# Patient Record
Sex: Female | Born: 1975 | Race: White | Hispanic: No | Marital: Married | Smoking: Never smoker
Health system: Southern US, Community
[De-identification: ages and names within clinical notes are randomized; demographics above are authoritative.]

## PROBLEM LIST (undated history)

## (undated) DIAGNOSIS — F32A Depression, unspecified: Secondary | ICD-10-CM

## (undated) DIAGNOSIS — L858 Other specified epidermal thickening: Secondary | ICD-10-CM

## (undated) DIAGNOSIS — F419 Anxiety disorder, unspecified: Secondary | ICD-10-CM

## (undated) DIAGNOSIS — E785 Hyperlipidemia, unspecified: Secondary | ICD-10-CM

## (undated) DIAGNOSIS — T7840XA Allergy, unspecified, initial encounter: Secondary | ICD-10-CM

## (undated) DIAGNOSIS — I10 Essential (primary) hypertension: Secondary | ICD-10-CM

## (undated) DIAGNOSIS — F329 Major depressive disorder, single episode, unspecified: Secondary | ICD-10-CM

## (undated) HISTORY — DX: Other specified epidermal thickening: L85.8

## (undated) HISTORY — DX: Hyperlipidemia, unspecified: E78.5

## (undated) HISTORY — DX: Allergy, unspecified, initial encounter: T78.40XA

## (undated) HISTORY — DX: Depression, unspecified: F32.A

## (undated) HISTORY — DX: Major depressive disorder, single episode, unspecified: F32.9

## (undated) HISTORY — PX: TONSILLECTOMY: SUR1361

## (undated) HISTORY — PX: WISDOM TOOTH EXTRACTION: SHX21

---

## 2000-07-18 ENCOUNTER — Other Ambulatory Visit: Admission: RE | Admit: 2000-07-18 | Discharge: 2000-07-18 | Payer: Self-pay | Admitting: Family Medicine

## 2007-01-18 ENCOUNTER — Other Ambulatory Visit: Admission: RE | Admit: 2007-01-18 | Discharge: 2007-01-18 | Payer: Self-pay | Admitting: Obstetrics and Gynecology

## 2008-01-24 ENCOUNTER — Other Ambulatory Visit: Admission: RE | Admit: 2008-01-24 | Discharge: 2008-01-24 | Payer: Self-pay | Admitting: Obstetrics and Gynecology

## 2009-03-20 ENCOUNTER — Other Ambulatory Visit: Admission: RE | Admit: 2009-03-20 | Discharge: 2009-03-20 | Payer: Self-pay | Admitting: Family Medicine

## 2010-03-24 ENCOUNTER — Other Ambulatory Visit
Admission: RE | Admit: 2010-03-24 | Discharge: 2010-03-24 | Payer: Self-pay | Source: Home / Self Care | Admitting: Family Medicine

## 2011-11-06 ENCOUNTER — Emergency Department (HOSPITAL_COMMUNITY)
Admission: EM | Admit: 2011-11-06 | Discharge: 2011-11-06 | Disposition: A | Discharge status: Home / Self Care | Payer: No Typology Code available for payment source | Attending: Emergency Medicine | Admitting: Emergency Medicine

## 2011-11-06 ENCOUNTER — Encounter (HOSPITAL_COMMUNITY): Payer: Self-pay | Admitting: *Deleted

## 2011-11-06 DIAGNOSIS — S61259A Open bite of unspecified finger without damage to nail, initial encounter: Secondary | ICD-10-CM

## 2011-11-06 DIAGNOSIS — Z203 Contact with and (suspected) exposure to rabies: Secondary | ICD-10-CM | POA: Insufficient documentation

## 2011-11-06 DIAGNOSIS — IMO0001 Reserved for inherently not codable concepts without codable children: Secondary | ICD-10-CM | POA: Insufficient documentation

## 2011-11-06 DIAGNOSIS — Y92410 Unspecified street and highway as the place of occurrence of the external cause: Secondary | ICD-10-CM | POA: Insufficient documentation

## 2011-11-06 DIAGNOSIS — Z23 Encounter for immunization: Secondary | ICD-10-CM | POA: Insufficient documentation

## 2011-11-06 HISTORY — DX: Anxiety disorder, unspecified: F41.9

## 2011-11-06 MED ORDER — RABIES IMMUNE GLOBULIN 150 UNIT/ML IM INJ
INJECTION | INTRAMUSCULAR | Status: AC
  Filled 2011-11-06: qty 12

## 2011-11-06 MED ORDER — RABIES IMMUNE GLOBULIN 150 UNIT/ML IM INJ
20.0000 [IU]/kg | INJECTION | Freq: Once | INTRAMUSCULAR | Status: AC
  Administered 2011-11-06: 2100 [IU] via INTRAMUSCULAR
  Filled 2011-11-06: qty 14

## 2011-11-06 MED ORDER — RABIES VACCINE, PCEC IM SUSR
1.0000 mL | Freq: Once | INTRAMUSCULAR | Status: AC
  Administered 2011-11-06: 1 mL via INTRAMUSCULAR
  Filled 2011-11-06: qty 1

## 2011-11-06 MED ORDER — AMOXICILLIN-POT CLAVULANATE 875-125 MG PO TABS: 1.0000 | ORAL_TABLET | Freq: Two times a day (BID) | ORAL | Status: AC

## 2011-11-06 MED ORDER — RABIES IMMUNE GLOBULIN 150 UNIT/ML IM INJ
INJECTION | INTRAMUSCULAR | Status: AC
  Filled 2011-11-06: qty 2

## 2011-11-06 MED ORDER — AMOXICILLIN-POT CLAVULANATE 875-125 MG PO TABS
1.0000 | ORAL_TABLET | Freq: Once | ORAL | Status: AC
  Administered 2011-11-06: 1 via ORAL
  Filled 2011-11-06: qty 1

## 2011-11-06 NOTE — ED Notes (Addendum)
Pt was bitten by a stray kitten this afternoon on her left index finger. Pt notified Animal Control and they told her to come to ED for rabies series. They did not want to take the cat for testing.

## 2011-11-06 NOTE — ED Notes (Signed)
Observed for reaction. None noted. Pt given discharge instructions and follow-up discussed. Pt told to call or come back to ED with any problems or questions.

## 2011-11-06 NOTE — ED Notes (Signed)
Awaiting Hyperab injection from [facility].

## 2011-11-06 NOTE — ED Provider Notes (Signed)
Medical screening examination/treatment/procedure(s) were performed by non-physician practitioner and as supervising physician I was immediately available for consultation/collaboration.  Jomo Forand, MD 11/06/11 2107 

## 2011-11-06 NOTE — ED Provider Notes (Signed)
History     CSN: [id number]  Arrival date & time 11/06/11  1553   First MD Initiated Contact with Patient 11/06/11 1620      Chief Complaint  Patient presents with  . Animal Bite    (Consider location/radiation/quality/duration/timing/severity/associated sxs/prior treatment) HPI Comments: Pt saw a kitten she estimates to be 20-61 weeks old that she suspects was struck by a car.  She picked the kitten up and it bit her on the finger.  She took the animal home because she was concerned about rabies potential and wanted to have the animal in her control and because she didn't want to simply leave it on the street.  She called animal control  States animal control and a Fish farm manager came to the house.  She was told the animal would not be quarantined because she "took the animal into her control and it now belonged to her".  If she wanted it quarantined she would need to take it to a Psychiatric nurse.  It would either be quarantined or sent to  for rabies testing.  She has come for possible rabies series and for antibiotic treatment.  Patient is a 36 y.o. female presenting with animal bite. The history is provided by the patient. No language interpreter was used.  Animal Bite  The incident occurred just prior to arrival. The incident occurred at home. Head/neck injury location: R 2nd finger. There have been no prior injuries to these areas. She is right-handed. Her tetanus status is UTD. There were no sick contacts. She has received no recent medical care.    Past Medical History  Diagnosis Date  . Anxiety     Past Surgical History  Procedure Date  . Wisdom tooth extraction     History reviewed. No pertinent family history.  History  Substance Use Topics  . Smoking status: Never Smoker   . Smokeless tobacco: Not on file  . Alcohol Use: Yes     rarely    OB History    Grav Para Term Preterm Abortions TAB SAB Ect Mult Living                  Review of Systems    Constitutional: Negative for fever.  Skin: Positive for wound.  All other systems reviewed and are negative.    Allergies  Review of patient's allergies indicates no known allergies.  Home Medications   Current Outpatient Rx  Name Route Sig Dispense Refill  . AMOXICILLIN-POT CLAVULANATE 875-125 MG PO TABS Oral Take 1 tablet by mouth every 12 (twelve) hours. 14 tablet 0    BP 156/90  Pulse 76  Temp 98.2 F (36.8 C) (Oral)  Resp 18  Ht 5\' 10"  (1.778 m)  Wt 230 lb (104.327 kg)  BMI 33.00 kg/m2  SpO2 100%  LMP 10/11/2011  Physical Exam  Nursing note and vitals reviewed. Constitutional: She is oriented to person, place, and time. She appears well-developed and well-nourished. No distress.  HENT:  Head: Normocephalic and atraumatic.  Eyes: EOM are normal.  Neck: Normal range of motion.  Cardiovascular: Normal rate, regular rhythm and normal heart sounds.   Pulmonary/Chest: Effort normal and breath sounds normal.  Abdominal: Soft. She exhibits no distension. There is no tenderness.  Musculoskeletal: Normal range of motion.       Hands: Neurological: She is alert and oriented to person, place, and time.  Skin: Skin is warm and dry.  Psychiatric: She has a normal mood and affect. Judgment normal.  ED Course  Procedures (including critical care time)  Labs Reviewed - No data to display No results found.   1. Animal bite of finger       MDM  rx-augmentin 875, BID, 14 Rabies immune globulin and vaccination series(to be given at [facility])        Evalina Field, PA 11/06/11 1753

## 2011-11-09 ENCOUNTER — Encounter (HOSPITAL_COMMUNITY): Payer: Self-pay | Admitting: *Deleted

## 2011-11-09 ENCOUNTER — Emergency Department (INDEPENDENT_AMBULATORY_CARE_PROVIDER_SITE_OTHER)
Admission: EM | Admit: 2011-11-09 | Discharge: 2011-11-09 | Disposition: A | Discharge status: Home / Self Care | Payer: PRIVATE HEALTH INSURANCE | Source: Home / Self Care

## 2011-11-09 DIAGNOSIS — Z23 Encounter for immunization: Secondary | ICD-10-CM

## 2011-11-09 MED ORDER — RABIES VACCINE, PCEC IM SUSR
INTRAMUSCULAR | Status: AC
  Filled 2011-11-09: qty 1

## 2011-11-09 MED ORDER — RABIES VACCINE, PCEC IM SUSR
1.0000 mL | Freq: Once | INTRAMUSCULAR | Status: AC
  Administered 2011-11-09: 1 mL via INTRAMUSCULAR

## 2011-11-09 NOTE — ED Notes (Signed)
Here for 2nd rabies vaccine for kitten bite to L index finger. Puncture wound healing. No signs of infection. Taking her Augmentin.

## 2011-11-15 ENCOUNTER — Telehealth (HOSPITAL_COMMUNITY): Payer: Self-pay | Admitting: *Deleted

## 2011-11-15 NOTE — ED Notes (Signed)
I called and left a message to call.  Note on my calender that pt. did not come Sat. for 3 rd rabies vaccine because the animal tested negative. I was calling to verify this. Vassie Moselle 11/15/2011

## 2011-11-22 ENCOUNTER — Telehealth (HOSPITAL_COMMUNITY): Payer: Self-pay | Admitting: *Deleted

## 2011-11-22 NOTE — ED Notes (Signed)
Pt. Called back and verified that the cat was sent to the state lab and was negative for rabies. She was told she did not have to complete the rabies series.   Vassie Moselle 11/22/2011

## 2013-06-18 ENCOUNTER — Other Ambulatory Visit: Payer: Self-pay | Admitting: Family Medicine

## 2013-06-18 ENCOUNTER — Other Ambulatory Visit (HOSPITAL_COMMUNITY)
Admission: RE | Admit: 2013-06-18 | Discharge: 2013-06-18 | Disposition: A | Discharge status: Home / Self Care | Payer: BC Managed Care – PPO | Source: Ambulatory Visit | Attending: Family Medicine | Admitting: Family Medicine

## 2013-06-18 DIAGNOSIS — Z124 Encounter for screening for malignant neoplasm of cervix: Secondary | ICD-10-CM | POA: Insufficient documentation

## 2013-06-18 DIAGNOSIS — Z1151 Encounter for screening for human papillomavirus (HPV): Secondary | ICD-10-CM | POA: Insufficient documentation

## 2013-06-18 LAB — HEPATIC FUNCTION PANEL
ALT: 10 (ref 7–35)
AST: 11 — AB (ref 13–35)
Bilirubin, Total: 0.4

## 2013-06-18 LAB — TSH: TSH: 1.91 (ref 0.41–5.90)

## 2013-06-18 LAB — BASIC METABOLIC PANEL
BUN: 10 (ref 4–21)
BUN: 12 (ref 4–21)
CREATININE: 0.8 (ref 0.5–1.1)
CREATININE: 0.9 (ref 0.5–1.1)
GLUCOSE: 90
Glucose: 58
POTASSIUM: 4.3 (ref 3.4–5.3)
Potassium: 4 (ref 3.4–5.3)
Sodium: 137 (ref 137–147)
Sodium: 138 (ref 137–147)

## 2013-06-18 LAB — CBC AND DIFFERENTIAL
HCT: 44 (ref 36–46)
HEMOGLOBIN: 14.8 (ref 12.0–16.0)
Platelets: 349 (ref 150–399)
WBC: 8.5

## 2013-10-23 ENCOUNTER — Encounter (HOSPITAL_COMMUNITY): Payer: Self-pay | Admitting: Emergency Medicine

## 2013-10-23 ENCOUNTER — Emergency Department (HOSPITAL_COMMUNITY)
Admission: EM | Admit: 2013-10-23 | Discharge: 2013-10-23 | Disposition: A | Discharge status: Home / Self Care | Payer: BC Managed Care – PPO | Attending: Emergency Medicine | Admitting: Emergency Medicine

## 2013-10-23 DIAGNOSIS — I10 Essential (primary) hypertension: Secondary | ICD-10-CM | POA: Insufficient documentation

## 2013-10-23 DIAGNOSIS — L559 Sunburn, unspecified: Secondary | ICD-10-CM | POA: Insufficient documentation

## 2013-10-23 DIAGNOSIS — L089 Local infection of the skin and subcutaneous tissue, unspecified: Secondary | ICD-10-CM | POA: Insufficient documentation

## 2013-10-23 DIAGNOSIS — F411 Generalized anxiety disorder: Secondary | ICD-10-CM | POA: Insufficient documentation

## 2013-10-23 DIAGNOSIS — L550 Sunburn of first degree: Secondary | ICD-10-CM

## 2013-10-23 DIAGNOSIS — T798XXA Other early complications of trauma, initial encounter: Secondary | ICD-10-CM

## 2013-10-23 DIAGNOSIS — Z79899 Other long term (current) drug therapy: Secondary | ICD-10-CM | POA: Insufficient documentation

## 2013-10-23 HISTORY — DX: Essential (primary) hypertension: I10

## 2013-10-23 LAB — CBC WITH DIFFERENTIAL/PLATELET
BASOS ABS: 0.1 10*3/uL (ref 0.0–0.1)
Basophils Relative: 1 % (ref 0–1)
EOS ABS: 0.3 10*3/uL (ref 0.0–0.7)
Eosinophils Relative: 3 % (ref 0–5)
HCT: 39.2 % (ref 36.0–46.0)
Hemoglobin: 13.5 g/dL (ref 12.0–15.0)
LYMPHS ABS: 3.2 10*3/uL (ref 0.7–4.0)
LYMPHS PCT: 32 % (ref 12–46)
MCH: 31.8 pg (ref 26.0–34.0)
MCHC: 34.4 g/dL (ref 30.0–36.0)
MCV: 92.2 fL (ref 78.0–100.0)
Monocytes Absolute: 0.8 10*3/uL (ref 0.1–1.0)
Monocytes Relative: 8 % (ref 3–12)
NEUTROS PCT: 56 % (ref 43–77)
Neutro Abs: 5.8 10*3/uL (ref 1.7–7.7)
PLATELETS: 341 10*3/uL (ref 150–400)
RBC: 4.25 MIL/uL (ref 3.87–5.11)
RDW: 13.8 % (ref 11.5–15.5)
WBC: 10.2 10*3/uL (ref 4.0–10.5)

## 2013-10-23 LAB — BASIC METABOLIC PANEL
ANION GAP: 12 (ref 5–15)
BUN: 13 mg/dL (ref 6–23)
CO2: 24 meq/L (ref 19–32)
Calcium: 8.9 mg/dL (ref 8.4–10.5)
Chloride: 102 mEq/L (ref 96–112)
Creatinine, Ser: 0.8 mg/dL (ref 0.50–1.10)
GFR calc Af Amer: >90 mL/min (ref >90)
GLUCOSE: 104 mg/dL — AB (ref 70–99)
POTASSIUM: 3.7 meq/L (ref 3.7–5.3)
SODIUM: 138 meq/L (ref 137–147)

## 2013-10-23 MED ORDER — CEPHALEXIN 500 MG PO CAPS: 500.0000 mg | ORAL_CAPSULE | Freq: Four times a day (QID) | ORAL | Status: DC

## 2013-10-23 MED ORDER — CEPHALEXIN 500 MG PO CAPS: 1000.0000 mg | ORAL_CAPSULE | Freq: Two times a day (BID) | ORAL | Status: DC

## 2013-10-23 NOTE — ED Notes (Signed)
Small sore area to rt lower leg, mid shin.  Since then went tubing and got sunburn to both lower legs. Swelling of rt lower leg  With purulent d/c from rt leg.

## 2013-10-23 NOTE — Discharge Instructions (Signed)
Sunburn Sunburn is damage to the skin caused by overexposure to ultraviolet (UV) rays. People with light skin or a fair complexion may be more susceptible to sunburn. Repeated sun exposure causes early skin aging such as wrinkles and sun spots. It also increases the risk of skin cancer. CAUSES A sunburn is caused by getting too much UV radiation from the sun. SYMPTOMS  Red or pink skin.  Soreness and swelling.  Pain.  Blisters.  Peeling skin.  Headache, fever, and fatigue if sunburn covers a large area. TREATMENT  Your caregiver may tell you to take certain medicines to lessen inflammation.  Your caregiver may have you use hydrocortisone cream or spray to help with itching and inflammation.  Your caregiver may prescribe an antibiotic cream to use on blisters. HOME CARE INSTRUCTIONS   Avoid further exposure to the sun.  Cool baths and cool compresses may be helpful if used several times per day. Do not apply ice, since this may result in more damage to the skin.  Only take over-the-counter or prescription medicines for pain, discomfort, or fever as directed by your caregiver.  Use aloe or other over-the-counter sunburn creams or gels on your skin. Do not apply these creams or gels on blisters.  Drink enough fluids to keep your urine clear or pale yellow.  Do not break blisters. If blisters break, your caregiver may recommend an antibiotic cream to apply to the affected area. PREVENTION   Try to avoid the sun between 10:00 a.m. and 4:00 p.m. when it is the strongest.  Apply sunscreen at least 30 minutes before exposure to the sun.  Always wear protective hats, clothing, and sunglasses with UV protection.  Avoid medicines, herbs, and foods that increase your sensitivity to sunlight.  Avoid tanning beds. SEEK IMMEDIATE MEDICAL CARE IF:   You have a fever.  Your pain is uncontrolled with medicine.  You start to vomit or have diarrhea.  You feel faint or develop a  headache with confusion.  You develop severe blistering.  You have a pus-like (purulent) discharge coming from the blisters.  Your burn becomes more painful and swollen. MAKE SURE YOU:  Understand these instructions.  Will watch your condition.  Will get help right away if you are not doing well or get worse. Document Released: 12/16/2004 Document Revised: 07/03/2012 Document Reviewed: 08/30/2010 [facility] Patient Information 2015 [place], [place]. This information is not intended to replace advice given to you by your health care provider. Make sure you discuss any questions you have with your health care provider.   Apply cool compresses and elevate your legs frequently.  You may continue using your aloe product and your ibuprofen.  Get rechecked if you are not improving over the next several days.  I suspect this will fade away as your sun burn heals.  Your labs are normal tonight.

## 2013-10-25 NOTE — ED Provider Notes (Signed)
CSN: [id number]     Arrival date & time 10/23/13  2030 History   First MD Initiated Contact with Patient 10/23/13 2125     Chief Complaint  Patient presents with  . Leg Pain     (Consider location/radiation/quality/duration/timing/severity/associated sxs/prior Treatment) The history is provided by the patient and the spouse.   Donna Carey is a 38 y.o. female presenting for evaluation of sun burn injury, swelling and purulent drainage from a wound on her right lower leg.  She was out in the sun 3 days ago, tubing,  and used sunscreen, reapplying several times but still obtained a sunburn which is most pronounced on her lower legs,  Right greater than left. In addition, prior to the sunburn injury, she had a small scab on the right lower leg which she thought was from an insect bite injury.  The scabbed sloughed yesterday and a small amount of purulence ran out.  She has noticed swelling in her bilateral ankles since she incurred the sunburn.  She has used aloe and ibuprofen.  She describes a needlelike burning sensation in her skin which improves with ibuprofen use.     Past Medical History  Diagnosis Date  . Anxiety   . Hypertension    Past Surgical History  Procedure Laterality Date  . Wisdom tooth extraction     History reviewed. No pertinent family history. History  Substance Use Topics  . Smoking status: Never Smoker   . Smokeless tobacco: Not on file  . Alcohol Use: Yes     Comment: rarely   OB History   Grav Para Term Preterm Abortions TAB SAB Ect Mult Living                 Review of Systems  Constitutional: Negative for fever and chills.  Respiratory: Negative for shortness of breath and wheezing.   Cardiovascular: Positive for leg swelling.  Gastrointestinal: Negative for nausea.  Skin: Positive for color change, rash and wound.  Neurological: Negative for numbness.      Allergies  Codeine and Fish allergy  Home Medications   Prior to Admission  medications   Medication Sig Start Date End Date Taking? Authorizing Provider  amoxicillin-clavulanate (AUGMENTIN) 875-125 MG per tablet Take 1 tablet by mouth 2 (two) times daily. 10 day course starting on 10/16/2013 10/16/13  Yes Historical Provider, MD  citalopram (CELEXA) 40 MG tablet Take 40 mg by mouth daily.   Yes Historical Provider, MD  levonorgestrel-ethinyl estradiol (NORDETTE) 0.15-30 MG-MCG tablet Take 1 tablet by mouth daily.   Yes Historical Provider, MD  lisinopril (PRINIVIL,ZESTRIL) 10 MG tablet Take 10 mg by mouth daily. Daily   Yes Historical Provider, MD  cephALEXin (KEFLEX) 500 MG capsule Take 2 capsules (1,000 mg total) by mouth 2 (two) times daily. 10/23/13   Burgess Amor, PA-C   BP 140/84  Pulse 65  Temp(Src) 98.2 F (36.8 C) (Oral)  Resp 20  Ht 5\' 9"  (1.753 m)  Wt 235 lb (106.595 kg)  BMI 34.69 kg/m2  SpO2 100%  LMP 10/11/2013 Physical Exam  Constitutional: She appears well-developed and well-nourished. No distress.  HENT:  Head: Normocephalic.  Neck: Neck supple.  Cardiovascular: Normal rate.   Pulses:      Dorsalis pedis pulses are 2+ on the right side, and 2+ on the left side.  Pulmonary/Chest: Effort normal. She has no wheezes.  Musculoskeletal: Normal range of motion. She exhibits edema.  Bilateral lower extremity non pitting edema  to to mid tibia.  Skin: Burn and lesion noted. There is erythema.  Patchy deep erythema right greater than left lower anterior leg.  Tender.  No calf pain or edema.  Small ulceration mid right anterior tibia with no drainage, no fluctuance or induration.  Skin is warm.  No red streaking.    ED Course  Procedures (including critical care time) Labs Review Labs Reviewed  BASIC METABOLIC PANEL - Abnormal; Notable for the following:    Glucose, Bld 104 (*)    All other components within normal limits  CBC WITH DIFFERENTIAL    Imaging Review No results found.   EKG Interpretation None      MDM   Final diagnoses:   Sunburn of first degree  Wound infection, initial encounter    Suspect exam findings and pain mostly from sunburn.  Advised she may continued taking her ibuprofen,  Cool compresses,  Elevation.  Given h/o purulent drainage from wound will cover with keflex.  Advised f/u for any worsened sx.  Patients labs and/or radiological studies were viewed and considered during the medical decision making and disposition process.     Burgess AmorJulie Anara Cowman, PA-C 10/25/13 1654

## 2013-10-26 NOTE — ED Provider Notes (Signed)
Medical screening examination/treatment/procedure(s) were performed by non-physician practitioner and as supervising physician I was immediately available for consultation/collaboration.   EKG Interpretation None      Jaeson Molstad, MD, FACEP   Dionne Knoop L Mani Celestin, MD 10/26/13 0555 

## 2014-07-09 LAB — CBC AND DIFFERENTIAL
HEMATOCRIT: 43 (ref 36–46)
Hemoglobin: 13.9 (ref 12.0–16.0)
Platelets: 348 (ref 150–399)
WBC: 10.2

## 2014-07-09 LAB — LIPID PANEL
Cholesterol: 196 (ref 0–200)
HDL: 44 (ref 35–70)
LDL CALC: 120
Triglycerides: 161 — AB (ref 40–160)

## 2014-07-09 LAB — BASIC METABOLIC PANEL
BUN: 11 (ref 4–21)
CREATININE: 0.8 (ref 0.5–1.1)
GLUCOSE: 87
Potassium: 4.3 (ref 3.4–5.3)
SODIUM: 136 — AB (ref 137–147)

## 2014-07-09 LAB — TSH: TSH: 1.98 (ref 0.41–5.90)

## 2014-07-09 LAB — HEPATIC FUNCTION PANEL
ALT: 8 (ref 7–35)
AST: 8 — AB (ref 13–35)
BILIRUBIN, TOTAL: 0.4

## 2015-07-15 ENCOUNTER — Other Ambulatory Visit: Payer: Self-pay

## 2015-07-15 DIAGNOSIS — Z1231 Encounter for screening mammogram for malignant neoplasm of breast: Secondary | ICD-10-CM

## 2015-08-07 ENCOUNTER — Other Ambulatory Visit: Payer: Self-pay | Admitting: Family Medicine

## 2015-08-07 ENCOUNTER — Ambulatory Visit
Admission: RE | Admit: 2015-08-07 | Discharge: 2015-08-07 | Disposition: A | Discharge status: Home / Self Care | Payer: BLUE CROSS/BLUE SHIELD | Source: Ambulatory Visit

## 2015-08-07 DIAGNOSIS — R928 Other abnormal and inconclusive findings on diagnostic imaging of breast: Secondary | ICD-10-CM

## 2015-08-07 DIAGNOSIS — Z1231 Encounter for screening mammogram for malignant neoplasm of breast: Secondary | ICD-10-CM

## 2015-08-14 ENCOUNTER — Ambulatory Visit
Admission: RE | Admit: 2015-08-14 | Discharge: 2015-08-14 | Disposition: A | Discharge status: Home / Self Care | Payer: BLUE CROSS/BLUE SHIELD | Source: Ambulatory Visit | Attending: Family Medicine | Admitting: Family Medicine

## 2015-08-14 DIAGNOSIS — R928 Other abnormal and inconclusive findings on diagnostic imaging of breast: Secondary | ICD-10-CM

## 2015-08-21 LAB — LIPID PANEL
CHOLESTEROL: 198 (ref 0–200)
HDL: 49 (ref 35–70)
LDL Cholesterol: 124
TRIGLYCERIDES: 124 (ref 40–160)

## 2015-08-21 LAB — HEPATIC FUNCTION PANEL
ALT: 13 (ref 7–35)
AST: 12 — AB (ref 13–35)

## 2015-08-21 LAB — TSH: TSH: 1.52 (ref 0.41–5.90)

## 2015-08-21 LAB — BASIC METABOLIC PANEL
BUN: 12 (ref 4–21)
CREATININE: 0.8 (ref 0.5–1.1)
Glucose: 81
Potassium: 4.4 (ref 3.4–5.3)
Sodium: 139 (ref 137–147)

## 2015-08-21 LAB — CBC AND DIFFERENTIAL
HEMATOCRIT: 43 (ref 36–46)
HEMOGLOBIN: 14.1 (ref 12.0–16.0)
PLATELETS: 327 (ref 150–399)
WBC: 10.9

## 2015-08-21 LAB — VITAMIN D 25 HYDROXY (VIT D DEFICIENCY, FRACTURES): VIT D 25 HYDROXY: 27.4

## 2016-01-16 ENCOUNTER — Other Ambulatory Visit: Payer: Self-pay | Admitting: Family Medicine

## 2016-01-16 DIAGNOSIS — N6320 Unspecified lump in the left breast, unspecified quadrant: Secondary | ICD-10-CM

## 2016-02-23 ENCOUNTER — Other Ambulatory Visit: Payer: BLUE CROSS/BLUE SHIELD

## 2016-02-27 ENCOUNTER — Other Ambulatory Visit: Payer: BLUE CROSS/BLUE SHIELD

## 2016-03-29 ENCOUNTER — Ambulatory Visit
Admission: RE | Admit: 2016-03-29 | Discharge: 2016-03-29 | Disposition: A | Discharge status: Home / Self Care | Payer: BLUE CROSS/BLUE SHIELD | Source: Ambulatory Visit | Attending: Family Medicine | Admitting: Family Medicine

## 2016-03-29 DIAGNOSIS — N6320 Unspecified lump in the left breast, unspecified quadrant: Secondary | ICD-10-CM

## 2016-07-21 ENCOUNTER — Encounter: Payer: Self-pay | Admitting: Podiatry

## 2016-07-21 ENCOUNTER — Ambulatory Visit (INDEPENDENT_AMBULATORY_CARE_PROVIDER_SITE_OTHER): Payer: No Typology Code available for payment source | Admitting: Podiatry

## 2016-07-21 VITALS — BP 124/87 | HR 78 | Ht 70.0 in | Wt 250.0 lb

## 2016-07-21 DIAGNOSIS — M216X2 Other acquired deformities of left foot: Secondary | ICD-10-CM

## 2016-07-21 DIAGNOSIS — M722 Plantar fascial fibromatosis: Secondary | ICD-10-CM

## 2016-07-21 DIAGNOSIS — M21962 Unspecified acquired deformity of left lower leg: Secondary | ICD-10-CM | POA: Diagnosis not present

## 2016-07-21 NOTE — Progress Notes (Signed)
SUBJECTIVE: 41 y.o. year old female presents stating that she has a terrible Plantar fasciitis with heel pain in left foot. Has had for two and a half years. Used to be on feet all day when the pain started. Now she is not on feet much.  She is getting tired of injection. But she does want another injection and possible surgical cost. Has had orthotics, heel cups, Night Splints, and many injections.  REVIEW OF SYSTEMS: Pertinent items noted in HPI and remainder of comprehensive ROS otherwise negative.  OBJECTIVE: DERMATOLOGIC EXAMINATION: No abnormal findings.   VASCULAR EXAMINATION OF LOWER LIMBS: All pedal pulses are palpable with normal pulsation.  Capillary Filling times within 3 seconds in all digits.  No edema or erythema noted. Temperature gradient from tibial crest to dorsum of foot is within normal bilateral.  NEUROLOGIC EXAMINATION OF THE LOWER LIMBS: Achilles DTR is present and within normal. Monofilament (Semmes-Weinstein 10-gm) sensory testing positive 6 out of 6, bilateral. Vibratory sensations(128Hz  turning fork) intact at medial and lateral forefoot bilateral.  Sharp and Dull discriminatory sensations at the plantar ball of hallux is intact bilateral.   MUSCULOSKELETAL EXAMINATION: Positive for high arched cavus type foot. Rearfoot varus bilateral. Excess sagittal plane motion first ray bilateral L>R. Pain under heel and arch left foot.   ASSESSMENT: 1. Plantar fasciitis left, refractory to conservative treatment. 2. 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  PLAN: Reviewed clinical findings and available treatment options, none surgical and surgical ( possible Lapidus fusion with or without plantar fascial interventions) As per request Cortisone injection given to left heel.  Injection consisted of mixture of 4 mg Dexamethasone, 4 mg Triamcinolone, and 1 cc of 0.5% Marcaine plain.  Patient tolerated well without difficulty.  Patient  seems to under stand what was explained about her foot condition. Patient will return with her existing orthotics and to set surgery for left foot pain.

## 2016-07-21 NOTE — Patient Instructions (Signed)
Seen for pain in left heel. Noted of high arch foot with weakened first metatarsal bone. Cortisone injection given. May need surgical intervention, Lapidus fusion of the first Metatarso-cuneiform joint left foot. Return for pre op when ready.

## 2016-09-14 ENCOUNTER — Other Ambulatory Visit: Payer: Self-pay | Admitting: Orthopedic Surgery

## 2016-09-14 DIAGNOSIS — M79671 Pain in right foot: Secondary | ICD-10-CM

## 2016-09-15 ENCOUNTER — Encounter: Payer: Self-pay | Admitting: Family Medicine

## 2016-09-15 ENCOUNTER — Ambulatory Visit (INDEPENDENT_AMBULATORY_CARE_PROVIDER_SITE_OTHER): Payer: PRIVATE HEALTH INSURANCE | Admitting: Family Medicine

## 2016-09-15 VITALS — BP 110/70 | HR 70 | Resp 12 | Ht 70.0 in | Wt 252.1 lb

## 2016-09-15 DIAGNOSIS — I10 Essential (primary) hypertension: Secondary | ICD-10-CM

## 2016-09-15 DIAGNOSIS — J069 Acute upper respiratory infection, unspecified: Secondary | ICD-10-CM | POA: Diagnosis not present

## 2016-09-15 DIAGNOSIS — J309 Allergic rhinitis, unspecified: Secondary | ICD-10-CM | POA: Diagnosis not present

## 2016-09-15 DIAGNOSIS — E559 Vitamin D deficiency, unspecified: Secondary | ICD-10-CM

## 2016-09-15 DIAGNOSIS — F321 Major depressive disorder, single episode, moderate: Secondary | ICD-10-CM | POA: Insufficient documentation

## 2016-09-15 LAB — BASIC METABOLIC PANEL
BUN: 13 mg/dL (ref 6–23)
CALCIUM: 9.6 mg/dL (ref 8.4–10.5)
CO2: 29 mEq/L (ref 19–32)
Chloride: 104 mEq/L (ref 96–112)
Creatinine, Ser: 0.75 mg/dL (ref 0.40–1.20)
GFR: 90.13 mL/min (ref >60.00)
Glucose, Bld: 80 mg/dL (ref 70–99)
Potassium: 4.5 mEq/L (ref 3.5–5.1)
SODIUM: 137 meq/L (ref 135–145)

## 2016-09-15 LAB — VITAMIN D 25 HYDROXY (VIT D DEFICIENCY, FRACTURES): VITD: 22.88 ng/mL — AB (ref 30.00–100.00)

## 2016-09-15 MED ORDER — CITALOPRAM HYDROBROMIDE 40 MG PO TABS: 40.0000 mg | ORAL_TABLET | Freq: Every day | ORAL | 0 refills | Status: DC

## 2016-09-15 MED ORDER — LISINOPRIL 10 MG PO TABS: 10.0000 mg | ORAL_TABLET | Freq: Every day | ORAL | 1 refills | Status: DC

## 2016-09-15 MED ORDER — METHYLPREDNISOLONE ACETATE 80 MG/ML IJ SUSP
40.0000 mg | Freq: Once | INTRAMUSCULAR | Status: AC
  Administered 2016-09-15: 40 mg via INTRAMUSCULAR

## 2016-09-15 NOTE — Progress Notes (Signed)
HPI:   Donna Carey is a 41 y.o. female, who is here today to establish care with me.  [facility] [place] and more [place]. Last preventive routine visit: 09/2015.  Chronic medical problems: Foot pain, depression,anxiety,HTN, allergies,and GERD among some.    Concerns today: Medication refills and acute illness.  Refills on medications.  Hypertension:   Currently on Lisinopril 10 mg daily.  Dx a year ago,2017. She is not on OCP's,d/c 03/2016. LMP 08/18/16.  She is taking medications as instructed, no side effects reported.  She has not noted unusual headache, visual changes, exertional chest pain, dyspnea,  focal weakness, or edema.   Lab Results  Component Value Date   CREATININE 0.8 08/21/2015   BUN 12 08/21/2015   NA 139 08/21/2015   K 4.4 08/21/2015   CL 102 10/23/2013   CO2 24 10/23/2013    Depression/anxiety:  Has had these problems "all my life", she follows with psychiatrists q 4 months. She is on Celexa 40 mg, which has been filled by her former PCP. Also taking Abilify 5 mg.  She denies suicidal thoughts. Tolerating medications well, no side effects reported.  Respiratory symptoms: 3 days of "little" productive cough.  She has not noted fever, chill, or myalgias. Moderate nasal congestion, rhinorrhea, sore throat, and post nasal drainage.  Denies chest pain, dyspnea, or wheezing.   No Hx of recent travel. + Sick contact, husband had similar symptoms recently. No known insect bite. + Hx of allergies, she takes OTC Zyrtec.  OTC medications for this problem: Mucinex Symptoms otherwise stable.  Vit D deficiency: She is on Vit D3 2000 U daily.   Review of Systems  Constitutional: Positive for fatigue. Negative for activity change, appetite change and fever.  HENT: Positive for congestion, postnasal drip, rhinorrhea, sinus pressure and sore throat. Negative for facial  swelling, mouth sores, nosebleeds and trouble swallowing.   Eyes: Negative for redness and visual disturbance.  Respiratory: Positive for cough. Negative for shortness of breath and wheezing.   Cardiovascular: Negative for chest pain, palpitations and leg swelling.  Gastrointestinal: Negative for abdominal pain, nausea and vomiting.       Negative for changes in bowel habits.  Genitourinary: Negative for decreased urine volume and hematuria.  Musculoskeletal: Negative for myalgias.  Skin: Negative for rash.  Allergic/Immunologic: Positive for environmental allergies.  Neurological: Negative for syncope, weakness and headaches.  Hematological: Negative for adenopathy. Does not bruise/bleed easily.  Psychiatric/Behavioral: Negative for confusion and suicidal ideas. The patient is nervous/anxious.     No current outpatient prescriptions on file prior to visit.   No current facility-administered medications on file prior to visit.      Past Medical History:  Diagnosis Date  . Allergy   . Anxiety   . Depression   . Hyperlipidemia   . Hypertension   . Keratosis pilaris    Allergies  Allergen Reactions  . Codeine Nausea Only  . Fish Allergy     Eel Only    Family History  Problem Relation Age of Onset  . Mental illness Maternal Grandmother        schizophrenia  . Cancer Paternal Grandmother        ovarian ,breast,lung    Social History   Social History  . Marital status: Married    Spouse name: N/A  . Number of children: N/A  . Years of education: N/A   Social History  Main Topics  . Smoking status: Never Smoker  . Smokeless tobacco: Never Used  . Alcohol use Yes     Comment: rarely  . Drug use: No  . Sexual activity: Yes    Birth control/ protection: Pill   Other Topics Concern  . None   Social History Narrative  . None    Vitals:   09/15/16 1157  BP: 110/70  Pulse: 70  Resp: 12   O2 sat at RA 98% Body mass index is 36.18 kg/m.   Physical Exam    Nursing note and vitals reviewed. Constitutional: She is oriented to person, place, and time. She appears well-developed. She does not appear ill. No distress.  HENT:  Head: Atraumatic.  Right Ear: Tympanic membrane, external ear and ear canal normal.  Left Ear: Tympanic membrane, external ear and ear canal normal.  Nose: Rhinorrhea and septal deviation present. Right sinus exhibits no maxillary sinus tenderness and no frontal sinus tenderness. Left sinus exhibits no maxillary sinus tenderness and no frontal sinus tenderness.  Mouth/Throat: Uvula is midline and mucous membranes are normal. Posterior oropharyngeal erythema present. No oropharyngeal exudate or posterior oropharyngeal edema.  Eyes: Conjunctivae and EOM are normal.  Cardiovascular: Normal rate and regular rhythm.   No murmur heard. Pulses:      Dorsalis pedis pulses are 2+ on the right side, and 2+ on the left side.  Respiratory: Effort normal and breath sounds normal. No respiratory distress.  GI: Soft. She exhibits no mass. There is no hepatomegaly. There is no tenderness.  Musculoskeletal: She exhibits no edema or tenderness.  Lymphadenopathy:       Head (right side): No submandibular adenopathy present.       Head (left side): No submandibular adenopathy present.    She has cervical adenopathy.       Right cervical: Posterior cervical adenopathy present.       Left cervical: Posterior cervical adenopathy present.  Neurological: She is alert and oriented to person, place, and time. She has normal strength. Gait normal.  Skin: Skin is warm. No rash noted. No erythema.  Psychiatric: She has a normal mood and affect.  Well groomed, poor eye contact.    ASSESSMENT AND PLAN:   Donna Carey was seen today for establish care.  Diagnoses and all orders for this visit:  Lab Results  Component Value Date   CREATININE 0.75 09/15/2016   BUN 13 09/15/2016   NA 137 09/15/2016   K 4.5 09/15/2016   CL 104 09/15/2016   CO2  29 09/15/2016    URI, acute  Symptoms suggests a viral etiology, I explained that symptomatic treatment is usually recommended in this case, so I do not think abx is needed at this time. Instructed to monitor for signs of complications, clearly instructed about warning signs. I also explained that cough and nasal congestion can last a few days and sometimes weeks. F/U as needed.   Allergic rhinitis, unspecified seasonality, unspecified trigger  Could be aggravating symptoms. Continue Zyrtec 10 mg daily. Use Flonase nasal spray daily. Nasal irrigations with saline as needed. Steroids may help with symptoms, she has had injectable steroids, which helped. Depo Medrol 40 mg IM x 1. F/U as needed.   -     methylPREDNISolone acetate (DEPO-MEDROL) injection 40 mg; Inject 0.5 mLs (40 mg total) into the muscle once.   Hypertension, essential, benign  Adequately controlled. No changes in current management for now, educated about adverse effects of ACEI's if pregnancy occurs. So she  must prevent pregnancy.  DASH-low salt diet to continue. Eye exam recommended annually. F/U in 5-6 months, before if needed.  -     lisinopril (PRINIVIL,ZESTRIL) 10 MG tablet; Take 1 tablet (10 mg total) by mouth daily. Daily -     Basic metabolic panel  Depression, major, single episode, moderate (HCC)  Stable. No changes in current management. Continue following with psychiatrists, who fills Abilify. F/U in 5-6 months.  -     citalopram (CELEXA) 40 MG tablet; Take 1 tablet (40 mg total) by mouth daily.  Vitamin D deficiency, unspecified  No changes in current management, will follow labs done today and will give further recommendations accordingly.  -     VITAMIN D 25 Hydroxy (Vit-D Deficiency, Fractures)      Jhamir Pickup G. [place], MD  [facility]. [place] office.

## 2016-09-15 NOTE — Patient Instructions (Signed)
A few things to remember from today's visit:   URI, acute  Allergic rhinitis, unspecified seasonality, unspecified trigger  Hypertension, essential, benign - Plan: lisinopril (PRINIVIL,ZESTRIL) 10 MG tablet, Basic metabolic panel  Depression, major, single episode, moderate (HCC) - Plan: citalopram (CELEXA) 40 MG tablet  Vitamin D deficiency, unspecified - Plan: VITAMIN D 25 Hydroxy (Vit-D Deficiency, Fractures)  Blood pressure goal for most people is less than 140/90. Some populations (older than 60) the goal is less than 150/90.  Most recent cardiologists' recommendations recommend blood pressure at or less than 130/80.   Elevated blood pressure increases the risk of strokes, heart and kidney disease, and eye problems. Regular physical activity and a healthy diet (DASH diet) usually help. Low salt diet. Take medications as instructed.  Caution with some over the counter medications as cold medications, dietary products (for weight loss), and Ibuprofen or Aleve (frequent use);all these medications could cause elevation of blood pressure.  Pregnancy prevention strongly recommended.    viral infections are self-limited and we treat each symptom depending of severity.  Over the counter medications as decongestants and cold medications usually help, they need to be taken with caution if there is a history of high blood pressure or palpitations. Tylenol and/or Ibuprofen also helps with most symptoms (headache, muscle aching, fever,etc) Plenty of fluids. Honey helps with cough. Steam inhalations helps with runny nose, nasal congestion, and may prevent sinus infections. Cough and nasal congestion could last a few days and sometimes weeks. Please follow in not any better in 1-2 weeks or if symptoms get worse.   Please be sure medication list is accurate. If a new problem present, please set up appointment sooner than planned today.

## 2016-09-19 ENCOUNTER — Encounter: Payer: Self-pay | Admitting: Family Medicine

## 2016-10-04 ENCOUNTER — Telehealth: Payer: Self-pay | Admitting: Family Medicine

## 2016-10-04 NOTE — Telephone Encounter (Signed)
Informed patient of results and patient verbalized understanding.  

## 2016-10-04 NOTE — Telephone Encounter (Signed)
Pt would like blood work results from June 26

## 2017-01-08 ENCOUNTER — Other Ambulatory Visit: Payer: Self-pay | Admitting: Family Medicine

## 2017-01-08 DIAGNOSIS — F321 Major depressive disorder, single episode, moderate: Secondary | ICD-10-CM

## 2017-01-11 ENCOUNTER — Other Ambulatory Visit: Payer: Self-pay | Admitting: Family Medicine

## 2017-01-11 DIAGNOSIS — F321 Major depressive disorder, single episode, moderate: Secondary | ICD-10-CM

## 2017-02-13 NOTE — Progress Notes (Signed)
HPI:   [name] is a 41 y.o. female, who is here today for her routine physical.  Last CPE: 09/2015.  Regular exercise 3 or more time per week: Yes, going to the gyn for the past couple months. Following a healthy diet: She is doing better with her diet and has noted wt loss.  She lives with her husband.  Chronic medical problems: HTN,depression, GERD,vit D deficiency,and allergic rhinitis among some.  She follows with psychiatrist every 4 months.   Pap smear 2-3 years ago. She is in her 3rd day of menstrual cycle but would like her pap smear done today. M: Not sure about age, 25-15 yo. G:0  Hx of abnormal pap smears: Denies Hx of STD's: Denies.  Immunization History  Administered Date(s) Administered  . Rabies, IM 11/06/2011, 11/09/2011  . Rabies, intradermal 11/06/2011  . Tdap 06/15/2012    Mammogram: 03/2016 [facility] 3, 6 months follow up was recommended. She has not had it done because insurance will not cover it at the breast center. She has the order for mammogram but has not arranged appt.  Concerns today:  "Little bumps" she can palpate in abdominal wall, noted a year ago. She has not noted changes in lesions size, tenderness, skin changes, abdominal pain, or other associated symptoms.   Vit D deficiency, since 08/2016 she has been on OTC Vit D 4000 U daily.   Review of Systems  Constitutional: Positive for fatigue (No more than usual). Negative for appetite change, fever and unexpected weight change.  HENT: Negative for dental problem, hearing loss, nosebleeds, trouble swallowing and voice change.   Eyes: Negative for photophobia and visual disturbance.  Respiratory: Negative for cough, shortness of breath and wheezing.   Cardiovascular: Negative for chest pain and leg swelling.  Gastrointestinal: Negative for abdominal pain, blood in stool, nausea and vomiting.       No changes in bowel habits.  Endocrine: Negative for cold  intolerance, heat intolerance, polydipsia, polyphagia and polyuria.  Genitourinary: Negative for decreased urine volume, dysuria, hematuria, menstrual problem, vaginal bleeding and vaginal discharge.       No breast tenderness or nipple discharge.  Musculoskeletal: Negative for arthralgias and back pain.  Skin: Negative for rash.  Allergic/Immunologic: Positive for environmental allergies.  Neurological: Negative for syncope, weakness, numbness and headaches.  Hematological: Negative for adenopathy. Does not bruise/bleed easily.  Psychiatric/Behavioral: Negative for confusion and sleep disturbance. The patient is nervous/anxious.   All other systems reviewed and are negative.     Current Outpatient Medications on File Prior to Visit  Medication Sig Dispense Refill  . ARIPiprazole (ABILIFY) 5 MG tablet Take 5 mg by mouth daily.    . cetirizine (ZYRTEC) 10 MG tablet Take 10 mg by mouth daily.    . Cholecalciferol (VITAMIN D3) 2000 units TABS Take 1 tablet by mouth daily.    . citalopram (CELEXA) 40 MG tablet TAKE 1 TABLET BY MOUTH ONCE DAILY 90 tablet 0  . ranitidine (ZANTAC) 150 MG tablet Take 150 mg by mouth daily as needed for heartburn.     No current facility-administered medications on file prior to visit.      Past Medical History:  Diagnosis Date  . Allergy   . Anxiety   . Depression   . Hyperlipidemia   . Hypertension   . Keratosis pilaris     Past Surgical History:  Procedure Laterality Date  . WISDOM TOOTH EXTRACTION      Allergies  Allergen  Reactions  . Codeine Nausea Only  . Fish Allergy     Eel Only    Family History  Problem Relation Age of Onset  . Mental illness Maternal Grandmother        schizophrenia  . Cancer Paternal Grandmother        ovarian ,breast,lung    Social History   Socioeconomic History  . Marital status: Married    Spouse name: None  . Number of children: None  . Years of education: None  . Highest education level: None    Social Needs  . Financial resource strain: None  . Food insecurity - worry: None  . Food insecurity - inability: None  . Transportation needs - medical: None  . Transportation needs - non-medical: None  Occupational History  . None  Tobacco Use  . Smoking status: Never Smoker  . Smokeless tobacco: Never Used  Substance and Sexual Activity  . Alcohol use: Yes    Comment: rarely  . Drug use: No  . Sexual activity: Yes    Birth control/protection: Pill  Other Topics Concern  . None  Social History Narrative  . None     Vitals:   02/14/17 1133  BP: 126/77  Pulse: 71  Resp: 12  Temp: 98.7 F (37.1 C)  SpO2: 98%   Body mass index is 35.62 kg/m.   Wt Readings from Last 3 Encounters:  02/14/17 248 lb 4 oz (112.6 kg)  09/15/16 252 lb 2 oz (114.4 kg)  07/21/16 250 lb (113.4 kg)     Physical Exam  Nursing note and vitals reviewed. Constitutional: She is oriented to person, place, and time. She appears well-developed. No distress.  HENT:  Head: Normocephalic and atraumatic.  Right Ear: Hearing, tympanic membrane, external ear and ear canal normal.  Left Ear: Hearing, tympanic membrane, external ear and ear canal normal.  Mouth/Throat: Uvula is midline, oropharynx is clear and moist and mucous membranes are normal.  Eyes: Conjunctivae and EOM are normal. Pupils are equal, round, and reactive to light.  Neck: No tracheal deviation present. No thyromegaly present.  Cardiovascular: Normal rate and regular rhythm.  No murmur heard. Pulses:      Dorsalis pedis pulses are 2+ on the right side, and 2+ on the left side.  Respiratory: Effort normal and breath sounds normal. No respiratory distress.  GI: Soft. She exhibits no mass. There is no hepatomegaly. There is no tenderness.  Genitourinary: No breast swelling or tenderness. There is no rash, tenderness or lesion on the right labia. There is no rash, tenderness or lesion on the left labia. Uterus is not enlarged and not  tender. Cervix exhibits no motion tenderness. Right adnexum displays no mass, no tenderness and no fullness. Left adnexum displays no mass, no tenderness and no fullness. There is bleeding in the vagina. No erythema or tenderness in the vagina.  Genitourinary Comments: Breast: Fibrocystic changes bilateral. No masses, nipple discharge,or skin changes.  Musculoskeletal: She exhibits no edema or tenderness.  No major deformity or sing of synovitis appreciated.  Lymphadenopathy:    She has no cervical adenopathy.       Right: No inguinal and no supraclavicular adenopathy present.       Left: No inguinal and no supraclavicular adenopathy present.  Palpable lymph nodes left axilla,<= 1 cm not tender.  Neurological: She is alert and oriented to person, place, and time. She has normal strength. No cranial nerve deficit. Coordination and gait normal.  Reflex Scores:  Bicep reflexes are 2+ on the right side and 2+ on the left side.      Patellar reflexes are 2+ on the right side and 2+ on the left side. Skin: Skin is warm. No rash noted. No erythema.  Upon palpation of abdominal, small papular lesions in abdominal wall. No tender, defined borders, around 1 cm.  Psychiatric: She has a normal mood and affect.  Well groomed, poor eye contact.    ASSESSMENT AND PLAN:   Donna Carey was seen today for annual exam and gynecologic exam.  Diagnoses and all orders for this visit:  Lab Results  Component Value Date   CHOL 176 02/18/2017   HDL 40 (L) 02/18/2017   LDLCALC 124 08/21/2015   TRIG 116 02/18/2017   CHOLHDL 4.4 02/18/2017   Lab Results  Component Value Date   CREATININE 0.71 02/18/2017   BUN 12 02/18/2017   NA 139 02/18/2017   K 4.5 02/18/2017   CL 107 02/18/2017   CO2 24 02/18/2017   Lab Results  Component Value Date   ALT 12 02/18/2017   AST 15 02/18/2017   BILITOT 0.3 02/18/2017    Routine general medical examination at a health care facility  We discussed the  importance of regular physical activity and healthy diet for prevention of chronic illness and/or complications. Preventive guidelines reviewed. Vaccination up to date.  Ca++ and vit D supplementation recommended. Next CPE in a year.  The 10-year ASCVD risk score Denman George(Goff DC Montez HagemanJr., et al., 2013) is: 0.9%   Values used to calculate the score:     Age: 89 years     Sex: Female     Is Non-Hispanic African American: No     Diabetic: No     Tobacco smoker: No     Systolic Blood Pressure: 126 mmHg     Is BP treated: No     HDL Cholesterol: 40 mg/dL     Total Cholesterol: 176 mg/dL  Screening for lipid disorders -     Lipid panel; Future   Diabetes mellitus screening -     Comprehensive metabolic panel; Future   Cervical cancer screening -     Cytology - PAP ([place])  Vitamin D deficiency, unspecified  No changes in current management, will follow labs done today and will give further recommendations accordingly.  -     VITAMIN D 25 Hydroxy (Vit-D Deficiency, Fractures)    In regard to lesions palpated in abdomen, they seem to be in abdominal wall and most likely benign (?lipomas). She agrees with continue monitoring for changes, I do not think imaging is needed at this time.    Return in 1 year (on 02/14/2018) for CPE.      Betty G. SwazilandJordan, MD  [facility]. [place] office.

## 2017-02-14 ENCOUNTER — Encounter: Payer: Self-pay | Admitting: Family Medicine

## 2017-02-14 ENCOUNTER — Ambulatory Visit (INDEPENDENT_AMBULATORY_CARE_PROVIDER_SITE_OTHER): Payer: PRIVATE HEALTH INSURANCE | Admitting: Family Medicine

## 2017-02-14 ENCOUNTER — Other Ambulatory Visit (HOSPITAL_COMMUNITY)
Admission: RE | Admit: 2017-02-14 | Discharge: 2017-02-14 | Disposition: A | Discharge status: Home / Self Care | Payer: No Typology Code available for payment source | Source: Ambulatory Visit | Attending: Family Medicine | Admitting: Family Medicine

## 2017-02-14 VITALS — BP 126/77 | HR 71 | Temp 98.7°F | Resp 12 | Ht 70.0 in | Wt 248.2 lb

## 2017-02-14 DIAGNOSIS — Z124 Encounter for screening for malignant neoplasm of cervix: Secondary | ICD-10-CM | POA: Diagnosis present

## 2017-02-14 DIAGNOSIS — Z131 Encounter for screening for diabetes mellitus: Secondary | ICD-10-CM

## 2017-02-14 DIAGNOSIS — Z0000 Encounter for general adult medical examination without abnormal findings: Secondary | ICD-10-CM | POA: Diagnosis not present

## 2017-02-14 DIAGNOSIS — Z13220 Encounter for screening for lipoid disorders: Secondary | ICD-10-CM

## 2017-02-14 DIAGNOSIS — E559 Vitamin D deficiency, unspecified: Secondary | ICD-10-CM | POA: Diagnosis not present

## 2017-02-14 NOTE — Patient Instructions (Addendum)
A few things to remember from today's visit:   Routine general medical examination at a health care facility  Screening for lipid disorders - Plan: Lipid panel  Diabetes mellitus screening - Plan: Comprehensive metabolic panel  Cervical cancer screening - Plan: Cytology - PAP ([place])  Vitamin D deficiency, unspecified - Plan: VITAMIN D 25 Hydroxy (Vit-D Deficiency, Fractures)  Today you have you routine preventive visit.  At least 150 minutes of moderate exercise per week, daily brisk walking for 15-30 min is a good exercise option. Healthy diet low in saturated (animal) fats and sweets and consisting of fresh fruits and vegetables, lean meats such as fish and white chicken and whole grains.  These are some of recommendations for screening depending of age and risk factors:   - Vaccines:  Tdap vaccine every 10 years.  Shingles vaccine recommended at age 42, could be given after 42 years of age but not sure about insurance coverage.   Pneumonia vaccines:  Prevnar 13 at 65 and Pneumovax at 66. Sometimes Pneumovax is giving earlier if history of smoking, lung disease,diabetes,kidney disease among some.    Screening for diabetes at age 42 and every 3 years.  Cervical cancer prevention:  Pap smear starts at 42 years of age and continues periodically until 42 years old in low risk women. Pap smear every 3 years between 7421 and 42 years old. Pap smear every 3-5 years between women 30 and older if pap smear negative and HPV screening negative.   -Breast cancer: Mammogram: Please arrange appt, overdue.  Colon cancer screening: starts at 42 years old until 42 years old.  Cholesterol disorder screening at age 42 and every 3 years.  Also recommended:  1. Dental visit- Brush and floss your teeth twice daily; visit your dentist twice a year. 2. Eye doctor- Get an eye exam at least every 2 years. 3. Helmet use- Always wear a helmet when riding a bicycle, motorcycle,  rollerblading or skateboarding. 4. Safe sex- If you may be exposed to sexually transmitted infections, use a condom. 5. Seat belts- Seat belts can save your live; always wear one. 6. Smoke/Carbon Monoxide detectors- These detectors need to be installed on the appropriate level of your home. Replace batteries at least once a year. 7. Skin cancer- When out in the sun please cover up and use sunscreen 15 SPF or higher. 8. Violence- If anyone is threatening or hurting you, please tell your healthcare provider.  9. Drink alcohol in moderation- Limit alcohol intake to one drink or less per day. Never drink and drive.  Please be sure medication list is accurate. If a new problem present, please set up appointment sooner than planned today.

## 2017-02-15 LAB — CYTOLOGY - PAP
Diagnosis: NEGATIVE
HPV: NOT DETECTED

## 2017-02-16 ENCOUNTER — Encounter: Payer: Self-pay | Admitting: Family Medicine

## 2017-02-18 LAB — COMPREHENSIVE METABOLIC PANEL
AG RATIO: 1.5 (calc) (ref 1.0–2.5)
ALBUMIN MSPROF: 3.9 g/dL (ref 3.6–5.1)
ALT: 12 U/L (ref 6–29)
AST: 15 U/L (ref 10–30)
Alkaline phosphatase (APISO): 69 U/L (ref 33–115)
BILIRUBIN TOTAL: 0.3 mg/dL (ref 0.2–1.2)
BUN: 12 mg/dL (ref 7–25)
CALCIUM: 9.1 mg/dL (ref 8.6–10.2)
CO2: 24 mmol/L (ref 20–32)
Chloride: 107 mmol/L (ref 98–110)
Creat: 0.71 mg/dL (ref 0.50–1.10)
Globulin: 2.6 g/dL (calc) (ref 1.9–3.7)
Glucose, Bld: 84 mg/dL (ref 65–99)
Potassium: 4.5 mmol/L (ref 3.5–5.3)
Sodium: 139 mmol/L (ref 135–146)
TOTAL PROTEIN: 6.5 g/dL (ref 6.1–8.1)

## 2017-02-18 LAB — LIPID PANEL
CHOL/HDL RATIO: 4.4 (calc) (ref <5.0)
CHOLESTEROL: 176 mg/dL (ref <200)
HDL: 40 mg/dL — AB (ref >50)
LDL CHOLESTEROL (CALC): 114 mg/dL — AB
NON-HDL CHOLESTEROL (CALC): 136 mg/dL — AB (ref <130)
Triglycerides: 116 mg/dL (ref <150)

## 2017-02-18 LAB — VITAMIN D 25 HYDROXY (VIT D DEFICIENCY, FRACTURES): Vit D, 25-Hydroxy: 44 ng/mL (ref 30–100)

## 2017-02-20 ENCOUNTER — Encounter: Payer: Self-pay | Admitting: Family Medicine

## 2017-02-22 IMAGING — MG MM DIGITAL DIAGNOSTIC UNILAT*L* W/ TOMO W/ CAD
7 series · 8 of 15 positions shown · non-contrast
Comparison: Screening mammogram 08/07/2015.

CLINICAL DATA: 39-year-old female presenting for screening recall
from baseline mammogram for a possible left breast mass.

EXAM:
2D DIGITAL DIAGNOSTIC UNILATERAL LEFT MAMMOGRAM WITH CAD AND ADJUNCT
TOMO
LEFT BREAST ULTRASOUND

[L CC (1 of 2)]
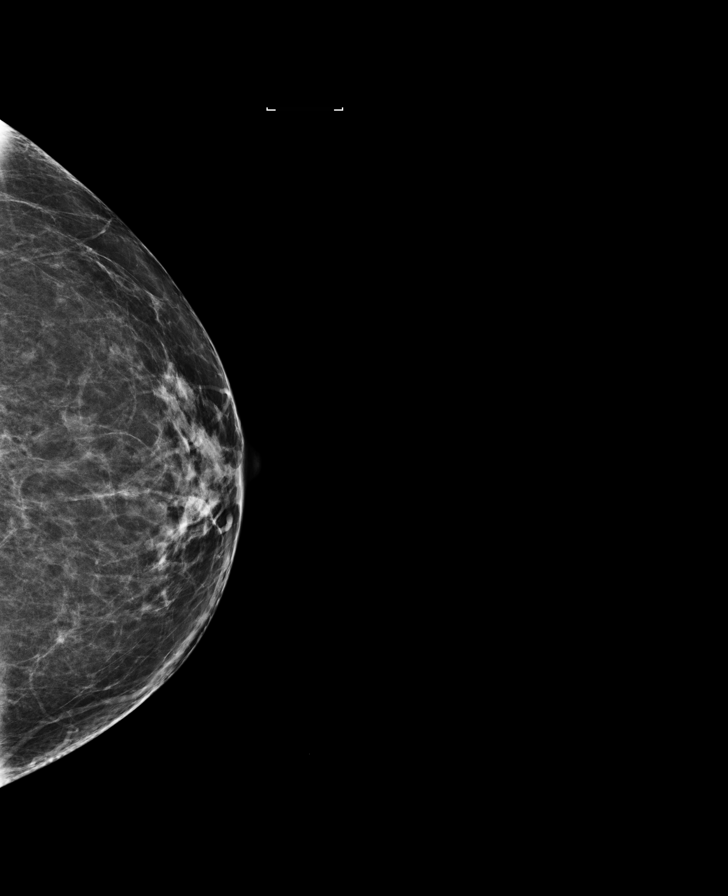

[L MLO]
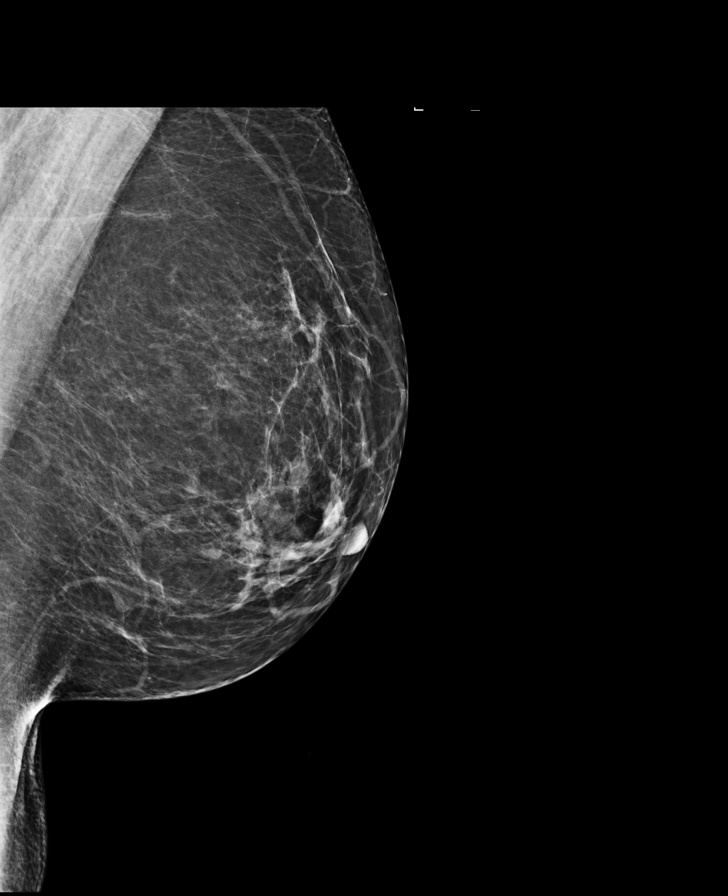

[L CC synth-2D]
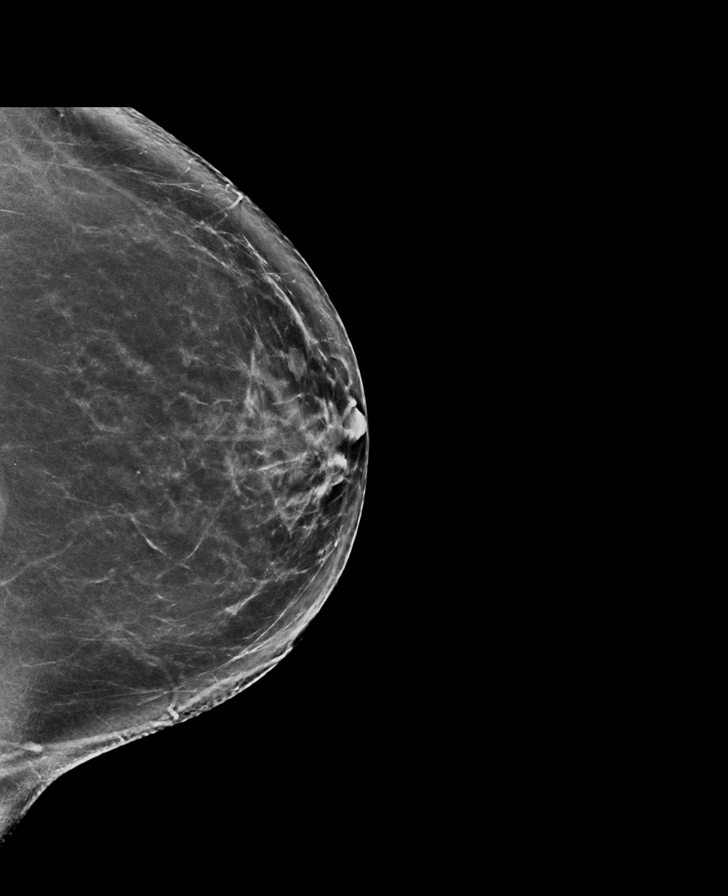

[L CC (2 of 2)]
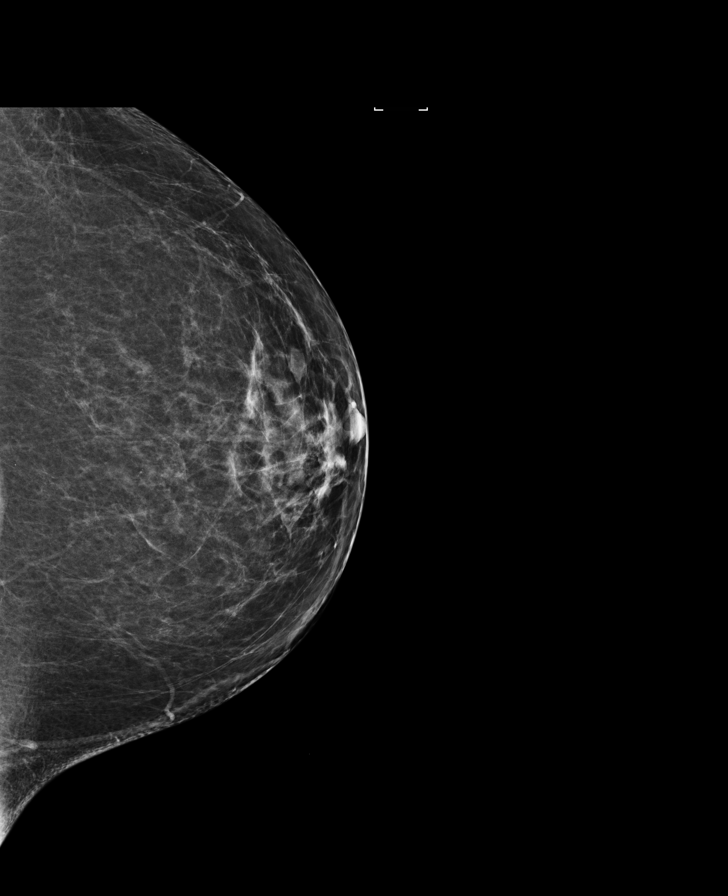

[L MLO synth-2D]
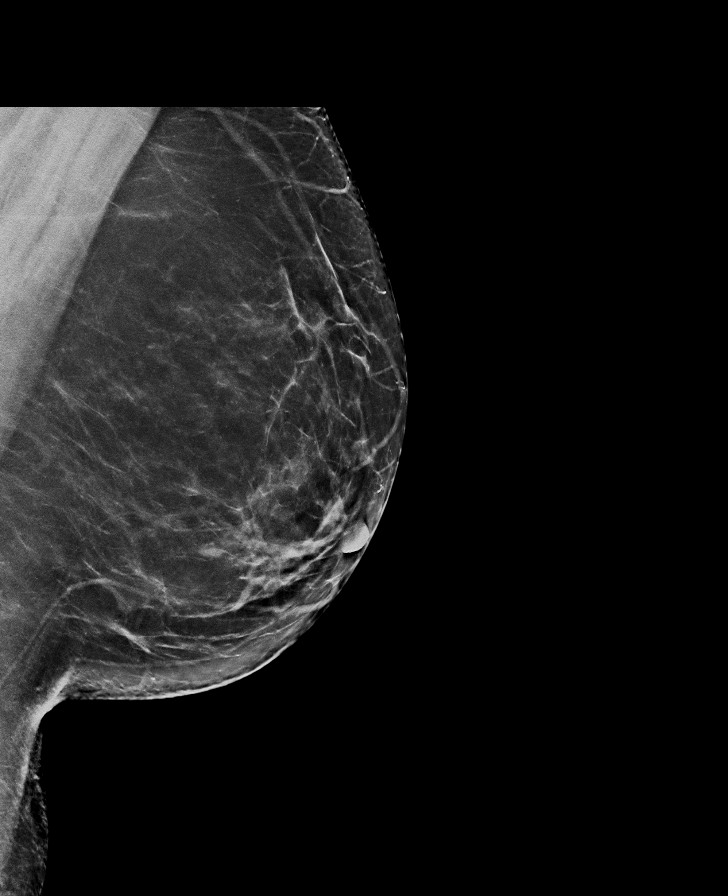

[L MLO tomo · 2 of 85 frames shown]
[frame 28/85]
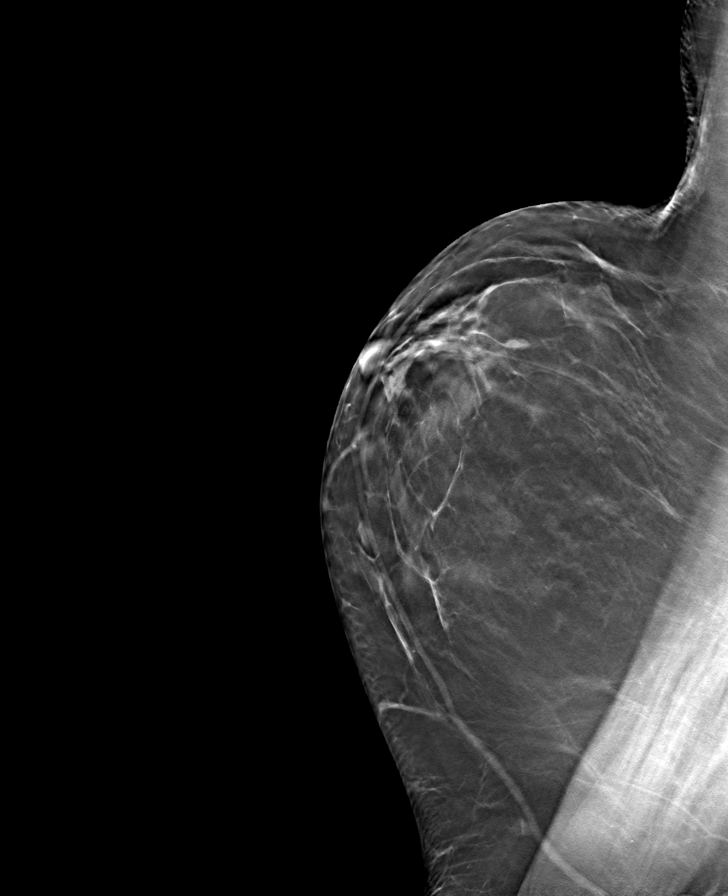
[frame 43/85]
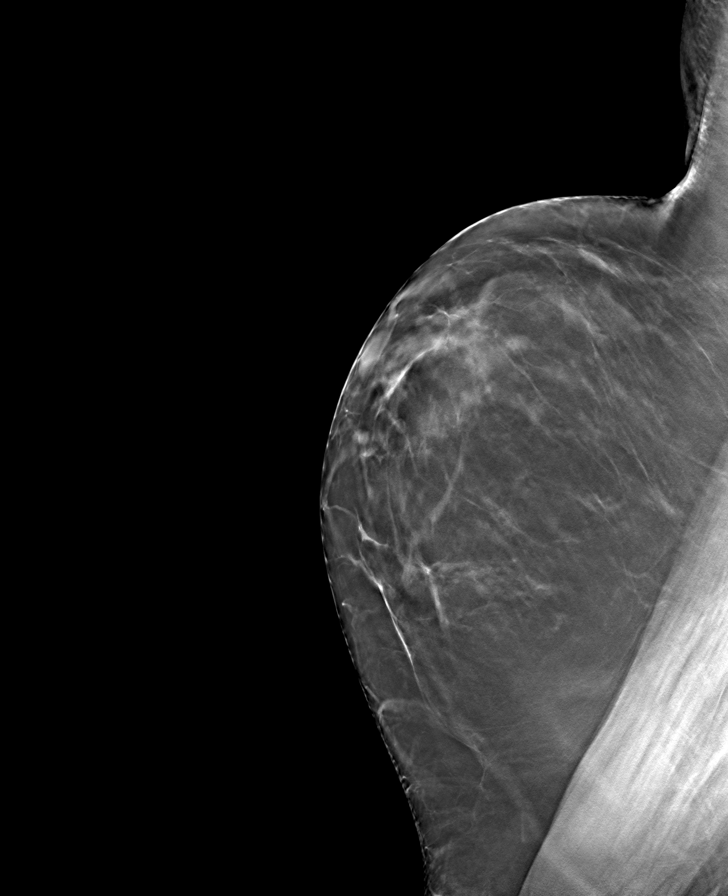

[L CC tomo · tomo slice 43/84.0]
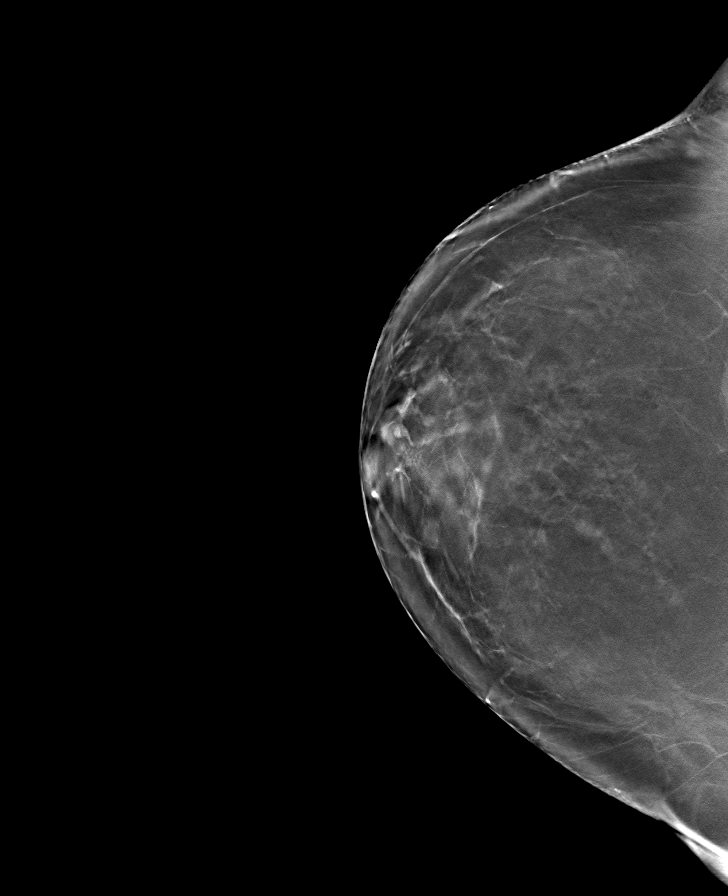

[8 of 15 positions shown; findings below may reference images not displayed]

ACR Breast Density Category b: There are scattered areas of
fibroglandular density.
FINDINGS: Two small subcentimeter masses are seen in the lower outer quadrant
of the left breast in the middle depth. No other suspicious
mammographic findings are identified in the left breast.

Mammographic images were processed with CAD.

Physical exam of the lower outer quadrant of the left breast
demonstrates no discrete palpable masses.

Ultrasound of the left breast at [DATE], 4 cm from the nipple
demonstrates a hypoechoic circumscribed oval mass measuring 1.1 x
0.2 x 0.4 cm. This is favored to represent a cyst. A similar
appearing mass is seen at 3 o'clock, 1 cm from the nipple measuring
0.4 x 0.1 x 0.4 cm. At 2 o'clock, 1 cm from the nipple there is an
oval hypoechoic circumscribed mass measuring 0.9 x 0.4 x 1.2 cm.
This appears likely solid though no blood flow is documented on
color Doppler imaging.
IMPRESSION: There are 3 probably benign masses in the left breast located at 2
o'clock, 3 o'clock and 530. The masses at 2 o'clock and 530 are
favored to represent benign cysts. The mass at 3 o'clock could be a
solid mass such as a fibroadenoma versus a complicated cyst.

RECOMMENDATION:
Six-month follow-up and diagnostic left breast mammogram and
ultrasound is recommended.

I have discussed the findings and recommendations with the patient.
Results were also provided in writing at the conclusion of the
visit. If applicable, a reminder letter will be sent to the patient
regarding the next appointment.

BI-RADS CATEGORY  3: Probably benign.

## 2017-02-23 ENCOUNTER — Encounter: Payer: PRIVATE HEALTH INSURANCE | Admitting: Family Medicine

## 2017-04-07 ENCOUNTER — Telehealth: Payer: Self-pay | Admitting: Family Medicine

## 2017-04-07 DIAGNOSIS — Z1231 Encounter for screening mammogram for malignant neoplasm of breast: Secondary | ICD-10-CM

## 2017-04-07 DIAGNOSIS — R928 Other abnormal and inconclusive findings on diagnostic imaging of breast: Secondary | ICD-10-CM

## 2017-04-07 NOTE — Telephone Encounter (Signed)
Patient notified of results and verbalized understanding.  

## 2017-04-07 NOTE — Telephone Encounter (Signed)
Copied from CRM [phone]. Topic: Referral - Request >> Apr 07, 2017  1:52 PM Percival SpanishKennedy, Cheryl W wrote:  [facility]   [phone]

## 2017-04-07 NOTE — Telephone Encounter (Signed)
Copied from CRM [phone]. Topic: Quick Communication - Lab Results >> Apr 07, 2017  1:19 PM Cecelia ByarsGreen, Malan Werk L, RMA wrote: Pt is requesting lab results

## 2017-04-08 NOTE — Telephone Encounter (Signed)
Request sent to Dr. SwazilandJordan for review

## 2017-04-08 NOTE — Telephone Encounter (Signed)
Is this for Dx mammogram? If it is for mammogram, order for Dx mammogram (bilateral) can be placed.Dx abnormal mammogram. Thanks, BJ

## 2017-04-09 ENCOUNTER — Other Ambulatory Visit: Payer: Self-pay | Admitting: Family Medicine

## 2017-04-09 DIAGNOSIS — F321 Major depressive disorder, single episode, moderate: Secondary | ICD-10-CM

## 2017-04-11 NOTE — Telephone Encounter (Signed)
Medication filled to pharmacy as requested.   

## 2017-04-12 ENCOUNTER — Other Ambulatory Visit: Payer: Self-pay | Admitting: *Deleted

## 2017-04-13 NOTE — Telephone Encounter (Signed)
Referral placed.

## 2017-04-18 NOTE — Telephone Encounter (Signed)
Called [facility]. They are part of [facility] and they are on Epic. Orders can be placed internally. Pt will need 3D diagnostic mammo and L/R breast US. Orders placed as directed.

## 2017-04-18 NOTE — Telephone Encounter (Signed)
Pt called - they need a diagnostic referral for mammo. She needs it sent to [facility].  cb for pt [phone]

## 2017-04-18 NOTE — Addendum Note (Signed)
Addended by: Starla LinkAKLEY, Akyla Vavrek J on: 04/18/2017 04:22 PM   Modules accepted: Orders

## 2017-05-02 ENCOUNTER — Ambulatory Visit
Admission: RE | Admit: 2017-05-02 | Discharge: 2017-05-02 | Disposition: A | Discharge status: Home / Self Care | Payer: No Typology Code available for payment source | Source: Ambulatory Visit | Attending: Family Medicine | Admitting: Family Medicine

## 2017-05-02 DIAGNOSIS — R928 Other abnormal and inconclusive findings on diagnostic imaging of breast: Secondary | ICD-10-CM | POA: Insufficient documentation

## 2017-05-02 DIAGNOSIS — N6320 Unspecified lump in the left breast, unspecified quadrant: Secondary | ICD-10-CM | POA: Insufficient documentation

## 2017-05-09 ENCOUNTER — Encounter: Payer: Self-pay | Admitting: Family Medicine

## 2017-05-09 ENCOUNTER — Ambulatory Visit (INDEPENDENT_AMBULATORY_CARE_PROVIDER_SITE_OTHER): Payer: No Typology Code available for payment source | Admitting: Family Medicine

## 2017-05-09 VITALS — BP 130/78 | HR 87 | Temp 98.5°F | Resp 12 | Ht 70.0 in | Wt 252.5 lb

## 2017-05-09 DIAGNOSIS — R350 Frequency of micturition: Secondary | ICD-10-CM | POA: Diagnosis not present

## 2017-05-09 DIAGNOSIS — R3915 Urgency of urination: Secondary | ICD-10-CM | POA: Diagnosis not present

## 2017-05-09 DIAGNOSIS — N390 Urinary tract infection, site not specified: Secondary | ICD-10-CM

## 2017-05-09 LAB — POCT URINALYSIS DIPSTICK
BILIRUBIN UA: NEGATIVE
Blood, UA: NEGATIVE
GLUCOSE UA: NEGATIVE
KETONES UA: NEGATIVE
Nitrite, UA: NEGATIVE
PH UA: 6.5 (ref 5.0–8.0)
Protein, UA: NEGATIVE
Spec Grav, UA: 1.015 (ref 1.010–1.025)
UROBILINOGEN UA: 0.2 U/dL

## 2017-05-09 LAB — POCT URINE PREGNANCY: Preg Test, Ur: NEGATIVE

## 2017-05-09 MED ORDER — NITROFURANTOIN MONOHYD MACRO 100 MG PO CAPS: 100.0000 mg | ORAL_CAPSULE | Freq: Two times a day (BID) | ORAL | 0 refills | Status: AC

## 2017-05-09 NOTE — Progress Notes (Signed)
HPI:   Donna Carey is a 42 y.o. female, who is here today complaining of 1-2 months of urinary symptoms. Symptoms have been worse for the past 2-3 days.  Dysuria: Denies Urinary frequency: Yes Urinary urgency: Yes, which has been intermittently for a while. Incontinence: Occasionally she doe snot make it to the bathroom. Gross hematuria: Denies  Abdominal pain: Suprapubic intermittent "funny" feeling. She has not identified exacerbating or alleviating factors. Denies associated fever, chills, body aches, or changes in bowel habits.  Nausea or vomiting: Denies.  Abnormal vaginal bleeding or discharge: Denies.  LMP: 04/11/17. Sexual activity: Yes,no more than usual. Hx of UTI: No  OTC medications for this problem: None. Well-groomed, good eye contact.  Review of Systems  Constitutional: Positive for fatigue. Negative for activity change, appetite change, chills and fever.  Gastrointestinal: Negative for abdominal pain, nausea and vomiting.       No changes in bowel habits.  Endocrine: Negative for polydipsia and polyphagia.  Genitourinary: Positive for frequency and urgency. Negative for decreased urine volume, dysuria, hematuria, vaginal bleeding and vaginal discharge.  Musculoskeletal: Negative for back pain and myalgias.  Skin: Negative for pallor and rash.  Psychiatric/Behavioral: Negative for confusion. The patient is nervous/anxious.       Current Outpatient Medications on File Prior to Visit  Medication Sig Dispense Refill  . ARIPiprazole (ABILIFY) 5 MG tablet Take 5 mg by mouth daily.    Marland Kitchen. buPROPion (WELLBUTRIN XL) 150 MG 24 hr tablet Take 150 mg by mouth daily.    . cetirizine (ZYRTEC) 10 MG tablet Take 10 mg by mouth daily.    . Cholecalciferol (VITAMIN D3) 2000 units TABS Take 1 tablet by mouth daily.    . citalopram (CELEXA) 40 MG tablet TAKE 1 TABLET BY MOUTH ONCE DAILY 90 tablet 2  . ranitidine (ZANTAC) 150 MG tablet Take 150 mg by  mouth daily as needed for heartburn.     No current facility-administered medications on file prior to visit.      Past Medical History:  Diagnosis Date  . Allergy   . Anxiety   . Depression   . Hyperlipidemia   . Hypertension   . Keratosis pilaris    Allergies  Allergen Reactions  . Codeine Nausea Only  . Fish Allergy     Eel Only    Social History   Socioeconomic History  . Marital status: Married    Spouse name: None  . Number of children: None  . Years of education: None  . Highest education level: None  Social Needs  . Financial resource strain: None  . Food insecurity - worry: None  . Food insecurity - inability: None  . Transportation needs - medical: None  . Transportation needs - non-medical: None  Occupational History  . None  Tobacco Use  . Smoking status: Never Smoker  . Smokeless tobacco: Never Used  Substance and Sexual Activity  . Alcohol use: Yes    Comment: rarely  . Drug use: No  . Sexual activity: Yes    Birth control/protection: Pill  Other Topics Concern  . None  Social History Narrative  . None    Vitals:   05/09/17 0936  BP: 130/78  Pulse: 87  Resp: 12  Temp: 98.5 F (36.9 C)  SpO2: 98%   Body mass index is 36.23 kg/m.   Physical Exam  Nursing note and vitals reviewed. Constitutional: She is oriented to person, place, and time. She appears well-developed. No  distress.  HENT:  Head: Normocephalic and atraumatic.  Mouth/Throat: Oropharynx is clear and moist and mucous membranes are normal.  Eyes: Conjunctivae are normal.  Cardiovascular: Normal rate and regular rhythm.  Respiratory: Effort normal and breath sounds normal. No respiratory distress.  GI: Soft. She exhibits no mass. There is tenderness (mild) in the suprapubic area. There is no rigidity, no rebound, no guarding and no CVA tenderness.  Musculoskeletal: She exhibits edema (LE pitting edema,1+,bilateral.). She exhibits no tenderness.  Neurological: She is  alert and oriented to person, place, and time.  Skin: Skin is warm. No rash noted. No erythema.  Psychiatric: Her mood appears anxious.  Well groomed, good eye contact.    ASSESSMENT AND PLAN:  [name] was seen today for urinary frequency, urinary urgency and abdominal pain.  Diagnoses and all orders for this visit:  Frequent urination  Urine dipstick abnormal,+ LEUK.  -     POCT urine pregnancy -     Urine culture  Urinary tract infection without hematuria, site unspecified  ? Mild cystitis. Other possible causes of reported urinary symptoms discussed. Ucx ordered.   Empiric abx treatment started today and will be tailored according to Ucx results and susceptibility report.  Clearly instructed about warning signs. F/U if symptoms persist.  -     nitrofurantoin, macrocrystal-monohydrate, (MACROBID) 100 MG capsule; Take 1 capsule (100 mg total) by mouth 2 (two) times daily for 5 days.  Urgency of urination  With some episodes of incontinence. ? Overactive bladder. We can consider treatment if symptoms do not resolve with antibiotic treatment. In the meantime [place] exercises may help.  -     Urine culture     -Donna Carey was advised to return or notify a doctor immediately if symptoms worsen or persist or new concerns arise.       Amir Glaus G. [place], MD  [facility]. [place] office.

## 2017-05-09 NOTE — Patient Instructions (Signed)
A few things to remember from today's visit:   Frequent urination - Plan: POCT urine pregnancy, Urine culture  Urinary tract infection without hematuria, site unspecified - Plan: nitrofurantoin, macrocrystal-monohydrate, (MACROBID) 100 MG capsule  Urgency of urination - Plan: Urine culture    Adequate fluid intake, avoid holding urine for long hours, and over the counter Vit C OR cranberry capsules might help.  Today we will treat empirically with antibiotic, which we might need to change when urine culture comes back depending of bacteria susceptibility.  Seek immediate medical attention if severe abdominal pain, vomiting, fever/chills, or worsening symptoms. F/U if symptomatic are not any better after 2-3 days of antibiotic treatment.   ? Overactive bladder.    Overactive Bladder, Adult Overactive bladder is a group of urinary symptoms. With overactive bladder, you may suddenly feel the need to pass urine (urinate) right away. After feeling this sudden urge, you might also leak urine if you cannot get to the bathroom fast enough (urinary incontinence). These symptoms might interfere with your daily work or social activities. Overactive bladder symptoms may also wake you up at night. Overactive bladder affects the nerve signals between your bladder and your brain. Your bladder may get the signal to empty before it is full. Very sensitive muscles can also make your bladder squeeze too soon. What are the causes? Many things can cause an overactive bladder. Possible causes include:  Urinary tract infection.  Infection of nearby tissues, such as the prostate.  Prostate enlargement.  Being pregnant with twins or more (multiples).  Surgery on the uterus or urethra.  Bladder stones, inflammation, or tumors.  Drinking too much caffeine or alcohol.  Certain medicines, especially those that you take to help your body get rid of extra fluid (diuretics) by increasing urine  production.  Muscle or nerve weakness, especially from: ? A spinal cord injury. ? Stroke. ? Multiple sclerosis. ? Parkinson disease.  Diabetes. This can cause a high urine volume that fills the bladder so quickly that the normal urge to urinate is triggered very strongly.  Constipation. A buildup of too much stool can put pressure on your bladder.  What increases the risk? You may be at greater risk for overactive bladder if you:  Are an older adult.  Smoke.  Are going through menopause.  Have prostate problems.  Have a neurological disease, such as stroke, dementia, Parkinson disease, or multiple sclerosis (MS).  Eat or drink things that irritate the bladder. These include alcohol, spicy food, and caffeine.  Are overweight or obese.  What are the signs or symptoms? The signs and symptoms of an overactive bladder include:  Sudden, strong urges to urinate.  Leaking urine.  Urinating eight or more times per day.  Waking up to urinate two or more times per night.  How is this diagnosed? Your health care provider may suspect overactive bladder based on your symptoms. The health care provider will do a physical exam and take your medical history. Blood or urine tests may also be done. For example, you might need to have a bladder function test to check how well you can hold your urine. You might also need to see a health care provider who specializes in the urinary tract (urologist). How is this treated? Treatment for overactive bladder depends on the cause of your condition and whether it is mild or severe. Certain treatments can be done in your health care provider's office or clinic. You can also make lifestyle changes at home. Options include:  Behavioral Treatments  Biofeedback. A specialist uses sensors to help you become aware of your body's signals.  Keeping a daily log of when you need to urinate and what happens after the urge. This may help you manage your  condition.  Bladder training. This helps you learn to control the urge to urinate by following a schedule that directs you to urinate at regular intervals (timed voiding). At first, you might have to wait a few minutes after feeling the urge. In time, you should be able to schedule bathroom visits an hour or more apart.  Kegel exercises. These are exercises to strengthen the pelvic floor muscles, which support the bladder. Toning these muscles can help you control urination, even if your bladder muscles are overactive. A specialist will teach you how to do these exercises correctly. They require daily practice.  Weight loss. If you are obese or overweight, losing weight might relieve your symptoms of overactive bladder. Talk to your health care provider about losing weight and whether there is a specific program or method that would work best for you.  Diet change. This might help if constipation is making your overactive bladder worse. Your health care provider or a dietitian can explain ways to change what you eat to ease constipation. You might also need to consume less alcohol and caffeine or drink other fluids at different times of the day.  Stopping smoking.  Wearing pads to absorb leakage while you wait for other treatments to take effect. Physical Treatments  Electrical stimulation. Electrodes send gentle pulses of electricity to strengthen the nerves or muscles that help to control the bladder. Sometimes, the electrodes are placed outside of the body. In other cases, they might be placed inside the body (implanted). This treatment can take several months to have an effect.  Supportive devices. Women may need a plastic device that fits into the vagina and supports the bladder (pessary). Medicines Several medicines can help treat overactive bladder and are usually used along with other treatments. Some are injected into the muscles involved in urination. Others come in pill form. Your  health care provider may prescribe:  Antispasmodics. These medicines block the signals that the nerves send to the bladder. This keeps the bladder from releasing urine at the wrong time.  Tricyclic antidepressants. These types of antidepressants also relax bladder muscles.  Surgery  You may have a device implanted to help manage the nerve signals that indicate when you need to urinate.  You may have surgery to implant electrodes for electrical stimulation.  Sometimes, very severe cases of overactive bladder require surgery to change the shape of the bladder. Follow these instructions at home:  Take medicines only as directed by your health care provider.  Use any implants or a pessary as directed by your health care provider.  Make any diet or lifestyle changes that are recommended by your health care provider. These might include: ? Drinking less fluid or drinking at different times of the day. If you need to urinate often during the night, you may need to stop drinking fluids early in the evening. ? Cutting down on caffeine or alcohol. Both can make an overactive bladder worse. Caffeine is found in coffee, tea, and sodas. ? Doing Kegel exercises to strengthen muscles. ? Losing weight if you need to. ? Eating a healthy and balanced diet to prevent constipation.  Keep a journal or log to track how much and when you drink and also when you feel the need to urinate.  This will help your health care provider to monitor your condition. Contact a health care provider if:  Your symptoms do not get better after treatment.  Your pain and discomfort are getting worse.  You have more frequent urges to urinate.  You have a fever. Get help right away if: You are not able to control your bladder at all. This information is not intended to replace advice given to you by your health care provider. Make sure you discuss any questions you have with your health care provider. Document Released:  01/02/2009 Document Revised: 08/14/2015 Document Reviewed: 08/01/2013 Elsevier Interactive Patient Education  [employer].  Please be sure medication list is accurate. If a new problem present, please set up appointment sooner than planned today.

## 2017-05-11 ENCOUNTER — Encounter: Payer: Self-pay | Admitting: Family Medicine

## 2017-05-11 LAB — URINE CULTURE
MICRO NUMBER:: 90214115
SPECIMEN QUALITY:: ADEQUATE

## 2017-12-19 ENCOUNTER — Ambulatory Visit: Payer: No Typology Code available for payment source | Admitting: Family Medicine

## 2017-12-19 ENCOUNTER — Encounter: Payer: Self-pay | Admitting: Family Medicine

## 2017-12-19 VITALS — BP 118/78 | HR 78 | Temp 98.4°F | Resp 12 | Ht 70.0 in | Wt 260.4 lb

## 2017-12-19 DIAGNOSIS — M170 Bilateral primary osteoarthritis of knee: Secondary | ICD-10-CM | POA: Diagnosis not present

## 2017-12-19 DIAGNOSIS — R112 Nausea with vomiting, unspecified: Secondary | ICD-10-CM | POA: Diagnosis not present

## 2017-12-19 DIAGNOSIS — K219 Gastro-esophageal reflux disease without esophagitis: Secondary | ICD-10-CM | POA: Insufficient documentation

## 2017-12-19 MED ORDER — OMEPRAZOLE 40 MG PO CPDR: 40.0000 mg | DELAYED_RELEASE_CAPSULE | Freq: Every day | ORAL | 1 refills | Status: DC

## 2017-12-19 MED ORDER — MELOXICAM 7.5 MG PO TABS: 7.5000 mg | ORAL_TABLET | Freq: Two times a day (BID) | ORAL | 0 refills | Status: DC | PRN

## 2017-12-19 NOTE — Patient Instructions (Addendum)
A few things to remember from today's visit:   Osteoarthritis of both knees, unspecified osteoarthritis type  Gastroesophageal reflux disease, esophagitis presence not specified - Plan: omeprazole (PRILOSEC) 40 MG capsule   GERD:  Avoid foods that make your symptoms worse, for example coffee, chocolate,pepermeint,alcohol, and greasy food. Raising the head of your bed about 6 inches may help with nocturnal symptoms.  Avoid tobacco use. Weight loss (if you are overweight). Avoid lying down for 3 hours after eating.  Instead 3 large meals daily try small and more frequent meals during the day.  Every medication have side effects and medications for GERD are not the exception.At this time I think benefit is greater than risk.    You should be evaluated immediately if bloody vomiting, bloody stools, black stools (like tar), difficulty swallowing, food gets stuck on the way down or choking when eating. Abnormal weight loss or severe abdominal pain.  If symptoms are not resolved sometimes endoscopy is necessary.   Omeprazole 40 mg 30 minutes before breakfast. Continue Zantac 150 mg at bedtime.  Please be sure medication list is accurate. If a new problem present, please set up appointment sooner than planned today.

## 2017-12-19 NOTE — Assessment & Plan Note (Signed)
We discussed treatment options and side effects of PPIs. She has disease with trying omeprazole 40 mg daily. She will continue Zantac 150 mg at bedtime. GERD precautions discussed. Instructed about warning signs. Follow-up in 6 weeks, before if needed.

## 2017-12-19 NOTE — Progress Notes (Signed)
ACUTE VISIT   HPI:  Chief Complaint  Patient presents with  . Nausea  . Gastroesophageal Reflux    [name] is a 42 y.o. female, who is here today complaining of a few months of occasional nausea and vomiting, which has been worse for the past 3-4 weeks. Today she has had 2 episodes of clear vomiting.  History of heartburn, exacerbated by certain foods: Meat intake and heavy meals. She takes Zantac 150 mg twice daily, which has helped with nausea and vomiting but no with heartburn. She has Nexium at home, today she took 1 OTC Nexium.  Denies fever, chills, worsening fatigue, changes in appetite, cough,CP,SOB,abdominal pain, changes in bowel habits,melana, or urine symptoms.  Knee pain, started taking Aleve 220 mg bid about a month ago. She has follow with orthopedist, who recommended Nexium 550 mg twice daily and a topical cream. She has not followed up with orthopedics as recommended. Pain is severe, exacerbated by movement and alleviated by rest. No erythema or knee edema. No history of trauma. According to patient, she was told she did not need intra-articular injections with our recommended to follow-up as needed.  She will like to have something to take for pain instead Aleve.   Review of Systems  Constitutional: Positive for fatigue. Negative for appetite change, chills and fever.  HENT: Negative for mouth sores, sore throat and trouble swallowing.   Respiratory: Negative for cough, shortness of breath and wheezing.   Cardiovascular: Negative for palpitations and leg swelling.  Gastrointestinal: Positive for nausea and vomiting. Negative for abdominal pain and blood in stool.  Genitourinary: Negative for decreased urine volume and hematuria.  Musculoskeletal: Positive for arthralgias. Negative for gait problem.  Neurological: Negative for syncope, weakness and headaches.  Psychiatric/Behavioral: Negative for confusion and sleep disturbance.       Current Outpatient Medications on File Prior to Visit  Medication Sig Dispense Refill  . ARIPiprazole (ABILIFY) 5 MG tablet Take 5 mg by mouth daily.    . cetirizine (ZYRTEC) 10 MG tablet Take 10 mg by mouth daily.    . citalopram (CELEXA) 40 MG tablet TAKE 1 TABLET BY MOUTH ONCE DAILY 90 tablet 2  . Fexofenadine HCl (ALLEGRA PO) Take by mouth.    . naproxen sodium (ALEVE) 220 MG tablet Take 220 mg by mouth 2 (two) times daily as needed.    . ranitidine (ZANTAC) 150 MG tablet Take 150 mg by mouth daily as needed for heartburn.    . Cholecalciferol (VITAMIN D3) 2000 units TABS Take 1 tablet by mouth daily.     No current facility-administered medications on file prior to visit.      Past Medical History:  Diagnosis Date  . Allergy   . Anxiety   . Depression   . Hyperlipidemia   . Hypertension   . Keratosis pilaris    Allergies  Allergen Reactions  . Codeine Nausea Only  . Fish Allergy     Eel Only    Social History   Socioeconomic History  . Marital status: Married    Spouse name: Not on file  . Number of children: Not on file  . Years of education: Not on file  . Highest education level: Not on file  Occupational History  . Not on file  Social Needs  . Financial resource strain: Not on file  . Food insecurity:    Worry: Not on file    Inability: Not on file  . Transportation needs:  Medical: Not on file    Non-medical: Not on file  Tobacco Use  . Smoking status: Never Smoker  . Smokeless tobacco: Never Used  Substance and Sexual Activity  . Alcohol use: Yes    Comment: rarely  . Drug use: No  . Sexual activity: Yes    Birth control/protection: Pill  Lifestyle  . Physical activity:    Days per week: Not on file    Minutes per session: Not on file  . Stress: Not on file  Relationships  . Social connections:    Talks on phone: Not on file    Gets together: Not on file    Attends religious service: Not on file    Active member of club or  organization: Not on file    Attends meetings of clubs or organizations: Not on file    Relationship status: Not on file  Other Topics Concern  . Not on file  Social History Narrative  . Not on file    Vitals:   12/19/17 1059  BP: 118/78  Pulse: 78  Resp: 12  Temp: 98.4 F (36.9 C)  SpO2: 97%   Body mass index is 37.36 kg/m.    Physical Exam  Nursing note and vitals reviewed. Constitutional: She is oriented to person, place, and time. She appears well-developed. She does not appear ill. No distress.  HENT:  Head: Normocephalic and atraumatic.  Mouth/Throat: Oropharynx is clear and moist and mucous membranes are normal.  Eyes: Conjunctivae are normal. No scleral icterus.  Cardiovascular: Normal rate and regular rhythm.  No murmur heard. Respiratory: Effort normal and breath sounds normal. No respiratory distress.  GI: Soft. Bowel sounds are normal. She exhibits no distension and no mass. There is no hepatomegaly. There is no tenderness.  Musculoskeletal: She exhibits no edema.       Right knee: She exhibits no effusion, no deformity and no erythema. Tenderness found.  Bilateral knee crepitus, L>R. Right knee pain with ROM.   Lymphadenopathy:    She has no cervical adenopathy.       Right: No supraclavicular adenopathy present.       Left: No supraclavicular adenopathy present.  Neurological: She is alert and oriented to person, place, and time. She has normal strength. Gait normal.  Skin: Skin is warm. No rash noted. No erythema.  Psychiatric: She has a normal mood and affect.  Well groomed, good eye contact.      ASSESSMENT AND PLAN:   [name] was seen today for nausea and gastroesophageal reflux.  Diagnoses and all orders for this visit:  Gastroesophageal reflux disease We discussed treatment options and side effects of PPIs. She has disease with trying omeprazole 40 mg daily. She will continue Zantac 150 mg at bedtime. GERD precautions  discussed. Instructed about warning signs. Follow-up in 6 weeks, before if needed.  Osteoarthritis of both knees We discussed side effects of NSAIDs. Recommend avoiding Aleve. Mobic may be better tolerated, she would like to try. Mobic 7.5 mg twice daily as needed recommended. She needs to follow with orthopedics as recommended. Follow-up as needed.   Nausea and vomiting in adult  Most likely related to GERD,other possible causes discussed. Bland diet. Adequate hydration. Side effects of NSAID;s discussed. F/U as needed.    Return in about 6 weeks (around 01/30/2018) for GERD.      Vinnie Bobst G. [place], MD  [facility]. [place] [facility].

## 2017-12-19 NOTE — Assessment & Plan Note (Signed)
We discussed side effects of NSAIDs. Recommend avoiding Aleve. Mobic may be better tolerated, she would like to try. Mobic 7.5 mg twice daily as needed recommended. She needs to follow with orthopedics as recommended. Follow-up as needed.

## 2018-01-09 ENCOUNTER — Other Ambulatory Visit: Payer: Self-pay | Admitting: Family Medicine

## 2018-01-09 DIAGNOSIS — F321 Major depressive disorder, single episode, moderate: Secondary | ICD-10-CM

## 2018-01-09 DIAGNOSIS — M170 Bilateral primary osteoarthritis of knee: Secondary | ICD-10-CM

## 2018-01-27 ENCOUNTER — Other Ambulatory Visit: Payer: Self-pay | Admitting: Family Medicine

## 2018-01-27 DIAGNOSIS — M170 Bilateral primary osteoarthritis of knee: Secondary | ICD-10-CM

## 2018-02-17 ENCOUNTER — Other Ambulatory Visit: Payer: Self-pay | Admitting: Family Medicine

## 2018-02-17 DIAGNOSIS — K219 Gastro-esophageal reflux disease without esophagitis: Secondary | ICD-10-CM

## 2018-02-17 DIAGNOSIS — M170 Bilateral primary osteoarthritis of knee: Secondary | ICD-10-CM

## 2018-02-24 ENCOUNTER — Encounter: Payer: No Typology Code available for payment source | Admitting: Family Medicine

## 2018-03-03 ENCOUNTER — Ambulatory Visit (INDEPENDENT_AMBULATORY_CARE_PROVIDER_SITE_OTHER): Payer: No Typology Code available for payment source | Admitting: Family Medicine

## 2018-03-03 ENCOUNTER — Telehealth: Payer: Self-pay | Admitting: Family Medicine

## 2018-03-03 ENCOUNTER — Encounter: Payer: Self-pay | Admitting: Family Medicine

## 2018-03-03 VITALS — BP 122/84 | HR 64 | Temp 98.6°F | Resp 12 | Ht 70.0 in | Wt 264.1 lb

## 2018-03-03 DIAGNOSIS — Z130 Encounter for screening for diseases of the blood and blood-forming organs and certain disorders involving the immune mechanism: Secondary | ICD-10-CM | POA: Diagnosis not present

## 2018-03-03 DIAGNOSIS — Z6837 Body mass index (BMI) 37.0-37.9, adult: Secondary | ICD-10-CM

## 2018-03-03 DIAGNOSIS — Z1329 Encounter for screening for other suspected endocrine disorder: Secondary | ICD-10-CM | POA: Diagnosis not present

## 2018-03-03 DIAGNOSIS — M170 Bilateral primary osteoarthritis of knee: Secondary | ICD-10-CM | POA: Diagnosis not present

## 2018-03-03 DIAGNOSIS — Z13228 Encounter for screening for other metabolic disorders: Secondary | ICD-10-CM

## 2018-03-03 DIAGNOSIS — Z13220 Encounter for screening for lipoid disorders: Secondary | ICD-10-CM

## 2018-03-03 DIAGNOSIS — Z0000 Encounter for general adult medical examination without abnormal findings: Secondary | ICD-10-CM | POA: Diagnosis not present

## 2018-03-03 DIAGNOSIS — E669 Obesity, unspecified: Secondary | ICD-10-CM | POA: Insufficient documentation

## 2018-03-03 DIAGNOSIS — F321 Major depressive disorder, single episode, moderate: Secondary | ICD-10-CM | POA: Diagnosis not present

## 2018-03-03 LAB — COMPREHENSIVE METABOLIC PANEL
ALK PHOS: 67 U/L (ref 39–117)
ALT: 11 U/L (ref 0–35)
AST: 11 U/L (ref 0–37)
Albumin: 4.3 g/dL (ref 3.5–5.2)
BUN: 11 mg/dL (ref 6–23)
CHLORIDE: 104 meq/L (ref 96–112)
CO2: 25 mEq/L (ref 19–32)
Calcium: 9.3 mg/dL (ref 8.4–10.5)
Creatinine, Ser: 0.74 mg/dL (ref 0.40–1.20)
GFR: 90.9 mL/min (ref >60.00)
GLUCOSE: 84 mg/dL (ref 70–99)
POTASSIUM: 4.3 meq/L (ref 3.5–5.1)
Sodium: 138 mEq/L (ref 135–145)
Total Bilirubin: 0.6 mg/dL (ref 0.2–1.2)
Total Protein: 6.8 g/dL (ref 6.0–8.3)

## 2018-03-03 LAB — LIPID PANEL
CHOLESTEROL: 195 mg/dL (ref 0–200)
HDL: 43.8 mg/dL (ref >39.00)
LDL CALC: 127 mg/dL — AB (ref 0–99)
NONHDL: 150.78
Total CHOL/HDL Ratio: 4
Triglycerides: 121 mg/dL (ref 0.0–149.0)
VLDL: 24.2 mg/dL (ref 0.0–40.0)

## 2018-03-03 MED ORDER — PHENTERMINE HCL 37.5 MG PO TABS: 37.5000 mg | ORAL_TABLET | Freq: Every day | ORAL | 1 refills | Status: DC

## 2018-03-03 NOTE — Telephone Encounter (Signed)
Message sent to Dr. Jordan for review and approval. 

## 2018-03-03 NOTE — Progress Notes (Signed)
HPI:   [name] is a 42 y.o. female, who is here today for her routine physical.  Last CPE: 02/14/18.  Regular exercise 3 or more time per week: None Following a healthy diet: Not consistently. She lives with her husband.  Hx of Depression,anxiety,vit D deficiency,GERD,and allergies among some. Depression on Celexa 2- mg daily and Abilify 5 mg daily. She follows with psychiatrist every 6 months.   Pap smear 01/2017 negative,HPV screening negative. Hx of abnormal pap smears: Not in the past 10 years. Hx of STD's: Negative.  Immunization History  Administered Date(s) Administered  . Rabies, IM 11/06/2011, 11/09/2011  . Rabies, intradermal 11/06/2011  . Tdap 06/15/2012    Mammogram: 04/2017 Bi-Rads 2 Colonoscopy: N/A DEXA: N/A  Concerns today: She wants to discuss pharmacologic options for wt loss.  She is concerned about wt gain. She has not been consistent with a healthy diet and regular physical activiies. She has not tried medications in the past.   She has Hx of knee OA and she is hoping that it gets better with wt loss. She takes Mobic 15 mg daily as needed,which helps.   Review of Systems  Constitutional: Negative for appetite change, fatigue and fever.  HENT: Negative for dental problem, hearing loss, mouth sores, sore throat, trouble swallowing and voice change.   Eyes: Negative for redness and visual disturbance.  Respiratory: Negative for cough, shortness of breath and wheezing.   Cardiovascular: Negative for chest pain and leg swelling.  Gastrointestinal: Negative for abdominal pain, nausea and vomiting.       No changes in bowel habits.  Endocrine: Negative for cold intolerance, heat intolerance, polydipsia, polyphagia and polyuria.  Genitourinary: Negative for decreased urine volume, dysuria, hematuria, vaginal bleeding and vaginal discharge.  Musculoskeletal: Positive for arthralgias. Negative for gait problem.  Skin: Negative  for color change and rash.  Allergic/Immunologic: Positive for environmental allergies.  Neurological: Negative for syncope, weakness and headaches.  Hematological: Negative for adenopathy. Does not bruise/bleed easily.  Psychiatric/Behavioral: Negative for confusion and sleep disturbance. The patient is nervous/anxious.   All other systems reviewed and are negative.     Current Outpatient Medications on File Prior to Visit  Medication Sig Dispense Refill  . ARIPiprazole (ABILIFY) 5 MG tablet Take 5 mg by mouth daily.    . cetirizine (ZYRTEC) 10 MG tablet Take 10 mg by mouth daily.    . Cholecalciferol (VITAMIN D3) 2000 units TABS Take 1 tablet by mouth daily.    . citalopram (CELEXA) 40 MG tablet TAKE 1 TABLET BY MOUTH ONCE DAILY 90 tablet 0  . Fexofenadine HCl (ALLEGRA PO) Take by mouth.    . meloxicam (MOBIC) 7.5 MG tablet TAKE 1 TABLET BY MOUTH TWICE DAILY AS NEEDED FOR PAIN 30 tablet 0  . omeprazole (PRILOSEC) 40 MG capsule TAKE 1 CAPSULE BY MOUTH ONCE DAILY 30 capsule 1  . ranitidine (ZANTAC) 150 MG tablet Take 150 mg by mouth daily as needed for heartburn.     No current facility-administered medications on file prior to visit.      Past Medical History:  Diagnosis Date  . Allergy   . Anxiety   . Depression   . Hyperlipidemia   . Hypertension   . Keratosis pilaris     Past Surgical History:  Procedure Laterality Date  . WISDOM TOOTH EXTRACTION      Allergies  Allergen Reactions  . Codeine Nausea Only  . Fish Allergy     Eel  Only    Family History  Problem Relation Age of Onset  . Mental illness Maternal Grandmother        schizophrenia  . Cancer Paternal Grandmother        ovarian ,breast,lung  . Breast cancer Paternal Grandmother     Social History   Socioeconomic History  . Marital status: Married    Spouse name: Not on file  . Number of children: Not on file  . Years of education: Not on file  . Highest education level: Not on file    Occupational History  . Not on file  Social Needs  . Financial resource strain: Not on file  . Food insecurity:    Worry: Not on file    Inability: Not on file  . Transportation needs:    Medical: Not on file    Non-medical: Not on file  Tobacco Use  . Smoking status: Never Smoker  . Smokeless tobacco: Never Used  Substance and Sexual Activity  . Alcohol use: Yes    Comment: rarely  . Drug use: No  . Sexual activity: Yes    Birth control/protection: Pill  Lifestyle  . Physical activity:    Days per week: Not on file    Minutes per session: Not on file  . Stress: Not on file  Relationships  . Social connections:    Talks on phone: Not on file    Gets together: Not on file    Attends religious service: Not on file    Active member of club or organization: Not on file    Attends meetings of clubs or organizations: Not on file    Relationship status: Not on file  Other Topics Concern  . Not on file  Social History Narrative  . Not on file     Vitals:   03/03/18 0936  BP: 122/84  Pulse: 64  Resp: 12  Temp: 98.6 F (37 C)  SpO2: 97%   Body mass index is 37.9 kg/m.   Wt Readings from Last 3 Encounters:  03/03/18 264 lb 2 oz (119.8 kg)  12/19/17 260 lb 6 oz (118.1 kg)  05/09/17 252 lb 8 oz (114.5 kg)    Physical Exam  Nursing note and vitals reviewed. Constitutional: She is oriented to person, place, and time. She appears well-developed. No distress.  HENT:  Head: Normocephalic and atraumatic.  Right Ear: Hearing, tympanic membrane, external ear and ear canal normal.  Left Ear: Hearing, tympanic membrane, external ear and ear canal normal.  Mouth/Throat: Uvula is midline, oropharynx is clear and moist and mucous membranes are normal.  Eyes: Pupils are equal, round, and reactive to light. Conjunctivae and EOM are normal.  Neck: No tracheal deviation present. No thyromegaly present.  Cardiovascular: Normal rate and regular rhythm.  No murmur  heard. Pulses:      Dorsalis pedis pulses are 2+ on the right side and 2+ on the left side.       Posterior tibial pulses are 2+ on the right side and 2+ on the left side.  Respiratory: Effort normal and breath sounds normal. No respiratory distress. No breast swelling or tenderness.  GI: Soft. She exhibits no mass. There is no hepatomegaly. There is no abdominal tenderness.  Genitourinary:    Genitourinary Comments: Breast: No masses, skin abnormalities, or nipple discharge appreciated bilateral.    Musculoskeletal:        General: No edema.     Comments: No major deformity or signs of synovitis appreciated.  Lymphadenopathy:    She has no cervical adenopathy.       Right: No supraclavicular adenopathy present.       Left: No supraclavicular adenopathy present.  Neurological: She is alert and oriented to person, place, and time. She has normal strength. No cranial nerve deficit. Coordination and gait normal.  Reflex Scores:      Bicep reflexes are 2+ on the right side and 2+ on the left side.      Patellar reflexes are 2+ on the right side and 2+ on the left side. Skin: Skin is warm. No rash noted. No erythema.  Psychiatric: Her mood appears anxious. Cognition and memory are normal.  Well groomed, good eye contact.      ASSESSMENT AND PLAN:  Donna Carey Carey was here today annual physical examination.   Orders Placed This Encounter  Procedures  . Comprehensive metabolic panel  . Lipid panel   Lab Results  Component Value Date   CHOL 195 03/03/2018   HDL 43.80 03/03/2018   LDLCALC 127 (H) 03/03/2018   TRIG 121.0 03/03/2018   CHOLHDL 4 03/03/2018   Lab Results  Component Value Date   CREATININE 0.74 03/03/2018   BUN 11 03/03/2018   NA 138 03/03/2018   K 4.3 03/03/2018   CL 104 03/03/2018   CO2 25 03/03/2018   Lab Results  Component Value Date   ALT 11 03/03/2018   AST 11 03/03/2018   ALKPHOS 67 03/03/2018   BILITOT 0.6 03/03/2018      Routine  general medical examination at a health care facility We discussed the importance of regular physical activity and healthy diet for prevention of chronic illness and/or complications. Preventive guidelines reviewed. Due for pap smear in 2023. Vaccination up to date. Next CPE in a year.   The 10-year ASCVD risk score Denman George DC Jr., et al., 2013) is: 0.9%   Values used to calculate the score:     Age: 25 years     Sex: Female     Is Non-Hispanic African American: No     Diabetic: No     Tobacco smoker: No     Systolic Blood Pressure: 122 mmHg     Is BP treated: No     HDL Cholesterol: 43.8 mg/dL     Total Cholesterol: 195 mg/dL   Screening for lipoid disorders -     Lipid panel  Screening for endocrine, metabolic and immunity disorder -     Comprehensive metabolic panel  Class 2 severe obesity due to excess calories with serious comorbidity and body mass index (BMI) of 37.0 to 37.9 in adult [facility]) We discussed benefits of wt loss as well as adverse effects of obesity. Consistency with healthy diet and physical activity recommended, this is the first line of treatment. Pharmacologic options discussed as well as side effects. She will let me know which one her insurance will cover. F/U in 6 weeks.  -     phentermine (ADIPEX-P) 37.5 MG tablet; Take 1 tablet (37.5 mg total) by mouth daily before breakfast. Start 1/2 tab x 7 days then increase to whole tab.  Osteoarthritis of both knees, unspecified osteoarthritis type Meloxican is helping,so continue. Wt loss certainly will help with progression. Some side effects of NSAID's discussed.   Depression, major, single episode, moderate (HCC) Stable. No changes in Celexa or Abilify. Continue following with psychiatrist.    Return in 1 year (on 03/04/2019).     Shereda Graw G. [place], MD    [facility]. [place] office.

## 2018-03-03 NOTE — Telephone Encounter (Signed)
Prescription for phentermine 37.5 mg tablet was sent to her pharmacy.  Recommend starting half tablet for 7 days and increase dose to the whole tablet as tolerated. 6 weeks follow-up appointment to be arranged.  Thanks, BJ

## 2018-03-03 NOTE — Telephone Encounter (Signed)
Copied from CRM [id number]. Topic: Quick Communication - See Telephone Encounter >> Mar 03, 2018  4:20 PM Terisa Starraylor, Brittany L wrote: CRM for notification. See Telephone encounter for: 03/03/18.  Patient wants to try " phentermine " for weight loss. She said that she was advised to call back after she spoke with her insurance. [facility] [place].

## 2018-03-03 NOTE — Telephone Encounter (Signed)
Patient informed that Rx was sent to the pharmacy and given directions. Patient verbalized understanding.

## 2018-03-03 NOTE — Patient Instructions (Addendum)
A few things to remember from today's visit:   Routine general medical examination at a health care facility  Screening for lipoid disorders - Plan: Lipid panel  Screening for endocrine, metabolic and immunity disorder - Plan: Comprehensive metabolic panel  Today you have you routine preventive visit.  At least 150 minutes of moderate exercise per week, daily brisk walking for 15-30 min is a good exercise option. Healthy diet low in saturated (animal) fats and sweets and consisting of fresh fruits and vegetables, lean meats such as fish and white chicken and whole grains.  These are some of recommendations for screening depending of age and risk factors:   - Vaccines:  Tdap vaccine every 10 years.  Shingles vaccine recommended at age 43, could be given after 43 years of age but not sure about insurance coverage.   Pneumonia vaccines:  Prevnar 13 at 65 and Pneumovax at 66. Sometimes Pneumovax is giving earlier if history of smoking, lung disease,diabetes,kidney disease among some.    Screening for diabetes at age 43 and every 3 years.  Cervical cancer prevention:  Pap smear starts at 43 years of age and continues periodically until 43 years old in low risk women. Pap smear every 3 years between 7921 and 43 years old. Pap smear every 3-5 years between women 30 and older if pap smear negative and HPV screening negative.   -Breast cancer: Mammogram: There is disagreement between experts about when to start screening in low risk asymptomatic female but recent recommendations are to start screening at 6040 and not later than 43 years old , every 1-2 years and after 43 yo q 2 years. Screening is recommended until 43 years old but some women can continue screening depending of healthy issues.   Colon cancer screening: starts at 43 years old until 43 years old.  Cholesterol disorder screening at age 43 and every 3 years.  Also recommended:  1. Dental visit- Brush and floss your teeth  twice daily; visit your dentist twice a year. 2. Eye doctor- Get an eye exam at least every 2 years. 3. Helmet use- Always wear a helmet when riding a bicycle, motorcycle, rollerblading or skateboarding. 4. Safe sex- If you may be exposed to sexually transmitted infections, use a condom. 5. Seat belts- Seat belts can save your live; always wear one. 6. Smoke/Carbon Monoxide detectors- These detectors need to be installed on the appropriate level of your home. Replace batteries at least once a year. 7. Skin cancer- When out in the sun please cover up and use sunscreen 15 SPF or higher. 8. Violence- If anyone is threatening or hurting you, please tell your healthcare provider.  9. Drink alcohol in moderation- Limit alcohol intake to one drink or less per day. Never drink and drive.   There are some pharmacologic options for weight loss.  Orlistat 120 mg 3 times per day with fat-containing meals. This medication can cause cramps, gassy feeling, stool incontinence/spotting. Rarely liver or kidney problems.  Phentermine-Topiramate: Controlled medication. Can cause dry mouth,constipation, palpitations,insomnia, depression or anxiety,numbness among some. Contraindicated in pregnancy.  Bupropion-Naltrexone:    Liraglutide 3 mg    Please be sure medication list is accurate. If a new problem present, please set up appointment sooner than planned today.

## 2018-03-04 ENCOUNTER — Encounter: Payer: Self-pay | Admitting: Family Medicine

## 2018-03-04 MED ORDER — MELOXICAM 7.5 MG PO TABS
7.5000 mg | ORAL_TABLET | Freq: Two times a day (BID) | ORAL | 2 refills | Status: DC | PRN
Start: 2018-03-04 — End: 2019-12-24

## 2018-03-23 ENCOUNTER — Other Ambulatory Visit: Payer: Self-pay | Admitting: Family Medicine

## 2018-03-23 DIAGNOSIS — Z1231 Encounter for screening mammogram for malignant neoplasm of breast: Secondary | ICD-10-CM

## 2018-04-11 ENCOUNTER — Other Ambulatory Visit: Payer: Self-pay | Admitting: Family Medicine

## 2018-04-11 DIAGNOSIS — F321 Major depressive disorder, single episode, moderate: Secondary | ICD-10-CM

## 2018-04-11 NOTE — Telephone Encounter (Signed)
Sent to the pharmacy by e-scribe.  Nothing further needed. 

## 2018-04-28 ENCOUNTER — Encounter: Payer: Self-pay | Admitting: Family Medicine

## 2018-04-28 ENCOUNTER — Ambulatory Visit: Payer: No Typology Code available for payment source | Admitting: Family Medicine

## 2018-04-28 VITALS — BP 122/66 | HR 99 | Temp 98.8°F | Resp 12 | Ht 70.0 in | Wt 255.4 lb

## 2018-04-28 DIAGNOSIS — Z6837 Body mass index (BMI) 37.0-37.9, adult: Secondary | ICD-10-CM | POA: Diagnosis not present

## 2018-04-28 DIAGNOSIS — K219 Gastro-esophageal reflux disease without esophagitis: Secondary | ICD-10-CM | POA: Diagnosis not present

## 2018-04-28 MED ORDER — PHENTERMINE HCL 37.5 MG PO TABS: 37.5000 mg | ORAL_TABLET | Freq: Every day | ORAL | 2 refills | Status: DC

## 2018-04-28 MED ORDER — OMEPRAZOLE 40 MG PO CPDR: 40.0000 mg | DELAYED_RELEASE_CAPSULE | Freq: Every day | ORAL | 1 refills | Status: DC | PRN

## 2018-04-28 NOTE — Progress Notes (Signed)
HPI:   Donna Carey is a 43 y.o. female, who is here today to follow on recent OV.  She is not exercising regularly, walking sometimes at work. She has a sedentary job.  She is trying to count calories.  She is on Phentermine 37.5 mg daily. She has tolerated medication well.   Last week she was sick with viral GI infection. Dx with influenza, completed Tamiflu treatment.  Nausea and vomiting have resolved. Still having loose stools but less frequent. Negative for chills, fever, or abdominal pain. No urinary symptoms.   GERD: Currently she is on omeprazole 40 mg. She did not take medication when she was sick, she has been asymptomatic. She wonders if she needs to take medication every day.    Review of Systems  Constitutional: Positive for appetite change. Negative for chills, fatigue and fever.  Respiratory: Negative for cough, shortness of breath and wheezing.   Cardiovascular: Negative for chest pain and palpitations.  Gastrointestinal: Positive for diarrhea. Negative for abdominal pain, nausea and vomiting.  Neurological: Negative for tremors, weakness and headaches.  Psychiatric/Behavioral: Negative for sleep disturbance. The patient is not nervous/anxious.     Current Outpatient Medications on File Prior to Visit  Medication Sig Dispense Refill  . ARIPiprazole (ABILIFY) 5 MG tablet Take 5 mg by mouth daily.    . Cholecalciferol (VITAMIN D3) 2000 units TABS Take 1 tablet by mouth daily.    . citalopram (CELEXA) 40 MG tablet Take 1 tablet by mouth once daily. 90 tablet 1  . diphenoxylate-atropine (LOMOTIL) 2.5-0.025 MG tablet Take 1-2 tablets by mouth every 8 (eight) hours as needed.    Marland Kitchen Fexofenadine HCl (ALLEGRA PO) Take by mouth.    . meloxicam (MOBIC) 7.5 MG tablet Take 1 tablet (7.5 mg total) by mouth 2 (two) times daily as needed. for pain 60 tablet 2   No current facility-administered medications on file prior to visit.      Past  Medical History:  Diagnosis Date  . Allergy   . Anxiety   . Depression   . Hyperlipidemia   . Hypertension   . Keratosis pilaris    Allergies  Allergen Reactions  . Codeine Nausea Only  . Fish Allergy     Eel Only    Social History   Socioeconomic History  . Marital status: Married    Spouse name: Not on file  . Number of children: Not on file  . Years of education: Not on file  . Highest education level: Not on file  Occupational History  . Not on file  Social Needs  . Financial resource strain: Not on file  . Food insecurity:    Worry: Not on file    Inability: Not on file  . Transportation needs:    Medical: Not on file    Non-medical: Not on file  Tobacco Use  . Smoking status: Never Smoker  . Smokeless tobacco: Never Used  Substance and Sexual Activity  . Alcohol use: Yes    Comment: rarely  . Drug use: No  . Sexual activity: Yes    Birth control/protection: Pill  Lifestyle  . Physical activity:    Days per week: Not on file    Minutes per session: Not on file  . Stress: Not on file  Relationships  . Social connections:    Talks on phone: Not on file    Gets together: Not on file    Attends religious service: Not on file  Active member of club or organization: Not on file    Attends meetings of clubs or organizations: Not on file    Relationship status: Not on file  Other Topics Concern  . Not on file  Social History Narrative  . Not on file    Vitals:   04/28/18 0913  BP: 122/66  Pulse: 99  Resp: 12  Temp: 98.8 F (37.1 C)  SpO2: 98%   Body mass index is 36.64 kg/m.  Wt Readings from Last 3 Encounters:  04/28/18 255 lb 6 oz (115.8 kg)  03/03/18 264 lb 2 oz (119.8 kg)  12/19/17 260 lb 6 oz (118.1 kg)     Physical Exam  Nursing note and vitals reviewed. Constitutional: She is oriented to person, place, and time. She appears well-developed. No distress.  HENT:  Head: Normocephalic and atraumatic.  Mouth/Throat: Oropharynx is  clear and moist and mucous membranes are normal.  Eyes: Pupils are equal, round, and reactive to light. Conjunctivae are normal.  Cardiovascular: Normal rate and regular rhythm.  No murmur heard. Respiratory: Effort normal and breath sounds normal. No respiratory distress.  GI: Soft. She exhibits no mass. There is no hepatomegaly. There is no abdominal tenderness.  Musculoskeletal:        General: No edema.  Lymphadenopathy:    She has no cervical adenopathy.  Neurological: She is alert and oriented to person, place, and time. She has normal strength. Gait normal.  Skin: Skin is warm. No rash noted. No erythema.  Psychiatric: She has a normal mood and affect.  Well groomed, poor eye contact.    ASSESSMENT AND PLAN:  Donna Carey was seen today for medication follow-up.  Diagnoses and all orders for this visit:  Class 2 severe obesity due to excess calories with serious comorbidity and body mass index (BMI) of 37.0 to 37.9 in adult [facility](HCC) Lost about 9 Lb. Side effects of Phentermine discussed. We discussed benefits of wt loss as well as adverse effects of obesity. Consistency with healthy diet and physical activity recommended. Daily brisk walking for 15-30 min as tolerated.  -     phentermine (ADIPEX-P) 37.5 MG tablet; Take 1 tablet (37.5 mg total) by mouth daily before breakfast. Start 1/2 tab x 7 days then increase to whole tab.  Gastroesophageal reflux disease, esophagitis presence not specified Recommend continuing with GERD precautions. Omeprazole 40 mg daily as needed. Next time we can try decreasing Omeprazole to 20 mg.  -     omeprazole (PRILOSEC) 40 MG capsule; Take 1 capsule (40 mg total) by mouth daily as needed.   Return in about 3 months (around 07/27/2018) for wt.      G. [place], MD  [facility]. [place] office.

## 2018-04-28 NOTE — Patient Instructions (Signed)
A few things to remember from today's visit:   Class 2 severe obesity due to excess calories with serious comorbidity and body mass index (BMI) of 37.0 to 37.9 in adult [facility])  Gastroesophageal reflux disease, esophagitis presence not specified  Wt Readings from Last 3 Encounters:  04/28/18 255 lb 6 oz (115.8 kg)  03/03/18 264 lb 2 oz (119.8 kg)  12/19/17 260 lb 6 oz (118.1 kg)   Continue Prilosec daily as needed.  GERD:  Avoid foods that make your symptoms worse, for example coffee, chocolate,pepermeint,alcohol, and greasy food. Raising the head of your bed about 6 inches may help with nocturnal symptoms.  Avoid tobacco use. Weight loss (if you are overweight). Avoid lying down for 3 hours after eating.  Instead 3 large meals daily try small and more frequent meals during the day.   You should be evaluated immediately if bloody vomiting, bloody stools, black stools (like tar), difficulty swallowing, food gets stuck on the way down or choking when eating. Abnormal weight loss or severe abdominal pain.  If symptoms are not resolved sometimes endoscopy is necessary.  Please be sure medication list is accurate. If a new problem present, please set up appointment sooner than planned today.

## 2018-05-04 ENCOUNTER — Other Ambulatory Visit: Payer: Self-pay | Admitting: Family Medicine

## 2018-05-04 DIAGNOSIS — K219 Gastro-esophageal reflux disease without esophagitis: Secondary | ICD-10-CM

## 2018-05-05 ENCOUNTER — Ambulatory Visit
Admission: RE | Admit: 2018-05-05 | Discharge: 2018-05-05 | Disposition: A | Discharge status: Home / Self Care | Payer: No Typology Code available for payment source | Source: Ambulatory Visit | Attending: Family Medicine | Admitting: Family Medicine

## 2018-05-05 DIAGNOSIS — Z1231 Encounter for screening mammogram for malignant neoplasm of breast: Secondary | ICD-10-CM | POA: Diagnosis present

## 2018-07-28 ENCOUNTER — Ambulatory Visit: Payer: No Typology Code available for payment source | Admitting: Family Medicine

## 2018-08-05 ENCOUNTER — Other Ambulatory Visit: Payer: Self-pay | Admitting: Family Medicine

## 2018-10-10 ENCOUNTER — Other Ambulatory Visit: Payer: Self-pay | Admitting: Family Medicine

## 2018-10-10 DIAGNOSIS — F321 Major depressive disorder, single episode, moderate: Secondary | ICD-10-CM

## 2018-10-18 ENCOUNTER — Encounter: Payer: Self-pay | Admitting: Family Medicine

## 2018-10-18 ENCOUNTER — Ambulatory Visit (INDEPENDENT_AMBULATORY_CARE_PROVIDER_SITE_OTHER): Payer: No Typology Code available for payment source | Admitting: Family Medicine

## 2018-10-18 ENCOUNTER — Other Ambulatory Visit: Payer: Self-pay

## 2018-10-18 DIAGNOSIS — M170 Bilateral primary osteoarthritis of knee: Secondary | ICD-10-CM | POA: Diagnosis not present

## 2018-10-18 DIAGNOSIS — K219 Gastro-esophageal reflux disease without esophagitis: Secondary | ICD-10-CM | POA: Diagnosis not present

## 2018-10-18 DIAGNOSIS — F321 Major depressive disorder, single episode, moderate: Secondary | ICD-10-CM

## 2018-10-18 DIAGNOSIS — Z6837 Body mass index (BMI) 37.0-37.9, adult: Secondary | ICD-10-CM

## 2018-10-18 MED ORDER — CITALOPRAM HYDROBROMIDE 40 MG PO TABS: ORAL_TABLET | ORAL | 3 refills | Status: DC

## 2018-10-18 MED ORDER — PHENTERMINE HCL 37.5 MG PO TABS: 37.5000 mg | ORAL_TABLET | Freq: Every day | ORAL | 1 refills | Status: DC

## 2018-10-18 NOTE — Assessment & Plan Note (Signed)
Problem is is stable. Continue Celexa 40 mg daily. As far as symptoms are is stable, we can continue following annually.

## 2018-10-18 NOTE — Assessment & Plan Note (Addendum)
Side effects of phentermine were discussed. She will resume medication x 2 months. We discussed benefits of wt loss as well as adverse effects of obesity. Consistency with healthy diet and physical activity recommended.

## 2018-10-18 NOTE — Assessment & Plan Note (Signed)
No changes in Mobic 7.5 mg twice daily as needed. We discussed some possible side effects of chronic NSAID use. BMP in normal range in 02/2018.

## 2018-10-18 NOTE — Assessment & Plan Note (Addendum)
Problem is well controlled with Pepcid AC at bedtime. Continue GERD precautions. Instructed about warning signs.

## 2018-10-18 NOTE — Progress Notes (Signed)
Virtual Visit via Video Note   I connected with Donna Carey on 10/18/18 at 10:00 AM EDT by a video enabled telemedicine application and verified that I am speaking with the correct person using two identifiers.  Location patient: home Location provider:work office Persons participating in the virtual visit: patient, provider  I discussed the limitations of evaluation and management by telemedicine and the availability of in person appointments. The patient expressed understanding and agreed to proceed.  Chief Complaint  Patient presents with  . Follow-up  . Medication Refill    Celexa   HPI: Donna Carey is a 43 yo female with Hx of HTN,obesity,and depression among some,who is following on some of her chronic medical problems. She was last seen on 04/28/18.  Depression, she does continue Abilify. Currently she is on Celexa 40 mg daily. States that she is no longer having chest pain, which she attributed to anxiety. She denies depressed mood or suicidal thoughts. She states that she is doing "pretty good", dealing well with stress. Denies palpitations and dyspnea.  She is following with psychiatrist about every 6-12 months but I continue filling prescription.  GERD: She is no longer on omeprazole 40 mg. Heartburn is worse at night when in bed. She is taking Pepcid AC at bedtime and this has helped. Negative for abdominal pain, nausea, vomiting, changes in bowel habits, or blood in the stool.  Obesity: She was on phentermine 37.5 mg daily, which was started on 03/03/2018. She felt like medication was helping with decreasing appetite and therefore with weight loss. She ran out of medication a few days ago. She tolerated medication well, reports no side effects.  She is not exercising regularly. She is trying to follow a healthful diet. She reported about 15 to 20 pounds weight loss since 02/2018.    ROS: See pertinent positives and negatives per HPI.  Past Medical History:   Diagnosis Date  . Allergy   . Anxiety   . Depression   . Hyperlipidemia   . Hypertension   . Keratosis pilaris     Past Surgical History:  Procedure Laterality Date  . WISDOM TOOTH EXTRACTION      Family History  Problem Relation Age of Onset  . Mental illness Maternal Grandmother        schizophrenia  . Cancer Paternal Grandmother        ovarian ,breast,lung  . Breast cancer Paternal Grandmother     Social History   Socioeconomic History  . Marital status: Married    Spouse name: Not on file  . Number of children: Not on file  . Years of education: Not on file  . Highest education level: Not on file  Occupational History  . Not on file  Social Needs  . Financial resource strain: Not on file  . Food insecurity    Worry: Not on file    Inability: Not on file  . Transportation needs    Medical: Not on file    Non-medical: Not on file  Tobacco Use  . Smoking status: Never Smoker  . Smokeless tobacco: Never Used  Substance and Sexual Activity  . Alcohol use: Yes    Comment: rarely  . Drug use: No  . Sexual activity: Yes    Birth control/protection: Pill  Lifestyle  . Physical activity    Days per week: Not on file    Minutes per session: Not on file  . Stress: Not on file  Relationships  . Social connections  Talks on phone: Not on file    Gets together: Not on file    Attends religious service: Not on file    Active member of club or organization: Not on file    Attends meetings of clubs or organizations: Not on file    Relationship status: Not on file  . Intimate partner violence    Fear of current or ex partner: Not on file    Emotionally abused: Not on file    Physically abused: Not on file    Forced sexual activity: Not on file  Other Topics Concern  . Not on file  Social History Narrative  . Not on file    Current Outpatient Medications:  .  famotidine (PEPCID) 20 MG tablet, Take 20 mg by mouth at bedtime as needed., Disp: , Rfl:  .   Cholecalciferol (VITAMIN D3) 2000 units TABS, Take 1 tablet by mouth daily., Disp: , Rfl:  .  citalopram (CELEXA) 40 MG tablet, Take 1 tablet by mouth once daily., Disp: 90 tablet, Rfl: 3 .  diphenoxylate-atropine (LOMOTIL) 2.5-0.025 MG tablet, Take 1-2 tablets by mouth every 8 (eight) hours as needed., Disp: , Rfl:  .  Fexofenadine HCl (ALLEGRA PO), Take by mouth., Disp: , Rfl:  .  meloxicam (MOBIC) 7.5 MG tablet, Take 1 tablet (7.5 mg total) by mouth 2 (two) times daily as needed. for pain, Disp: 60 tablet, Rfl: 2 .  phentermine (ADIPEX-P) 37.5 MG tablet, Take 1 tablet (37.5 mg total) by mouth daily before breakfast. Start 1/2 tab x 7 days then increase to whole tab., Disp: 30 tablet, Rfl: 1  EXAM:  VITALS per patient if applicable:Ht 5\' 10"  (1.778 m)   Wt 249 lb (112.9 kg)   BMI 35.73 kg/m  Wt Readings from Last 3 Encounters:  10/18/18 249 lb (112.9 kg)  04/28/18 255 lb 6 oz (115.8 kg)  03/03/18 264 lb 2 oz (119.8 kg)   GENERAL: alert, oriented, appears well and in no acute distress  HEENT: atraumatic, conjunctiva clear, no obvious facial abnormalities on inspection.  NECK: normal movements of the head and neck  LUNGS: on inspection no signs of respiratory distress, breathing rate appears normal, no obvious gross SOB, gasping or wheezing  CV: no obvious cyanosis  Donna: moves all visible extremities without noticeable abnormality  PSYCH/NEURO: pleasant and cooperative, no obvious depression or anxiety, speech and thought processing grossly intact  ASSESSMENT AND PLAN:  Discussed the following assessment and plan:  Gastroesophageal reflux disease Problem is well controlled with Pepcid AC at bedtime. Continue GERD precautions. Instructed about warning signs.  Osteoarthritis of both knees No changes in Mobic 7.5 mg twice daily as needed. We discussed some possible side effects of chronic NSAID use. BMP in normal range in 02/2018.  Depression, major, single episode, moderate  (HCC) Problem is is stable. Continue Celexa 40 mg daily. As far as symptoms are is stable, we can continue following annually.  Class 2 obesity with body mass index (BMI) of 37.0 to 37.9 in adult Side effects of phentermine were discussed. She will resume medication x 2 months. We discussed benefits of wt loss as well as adverse effects of obesity. Consistency with healthy diet and physical activity recommended.   I discussed the assessment and treatment plan with the patient. She was provided an opportunity to ask questions and all were answered. She agreed with the plan and demonstrated an understanding of the instructions.    Return in about 1 year (around 10/18/2019) for Has  CPE appt in 02/2019.     [place], MD

## 2019-05-25 LAB — HM MAMMOGRAPHY

## 2019-07-06 ENCOUNTER — Other Ambulatory Visit: Payer: Self-pay

## 2019-07-06 ENCOUNTER — Ambulatory Visit (INDEPENDENT_AMBULATORY_CARE_PROVIDER_SITE_OTHER): Payer: 59 | Admitting: Otolaryngology

## 2019-07-06 ENCOUNTER — Encounter (INDEPENDENT_AMBULATORY_CARE_PROVIDER_SITE_OTHER): Payer: Self-pay | Admitting: Otolaryngology

## 2019-07-06 VITALS — Temp 97.7°F

## 2019-07-06 DIAGNOSIS — J310 Chronic rhinitis: Secondary | ICD-10-CM

## 2019-07-06 DIAGNOSIS — H9202 Otalgia, left ear: Secondary | ICD-10-CM | POA: Diagnosis not present

## 2019-07-06 DIAGNOSIS — M26609 Unspecified temporomandibular joint disorder, unspecified side: Secondary | ICD-10-CM | POA: Diagnosis not present

## 2019-07-06 NOTE — Progress Notes (Signed)
HPI: Donna Carey is a 44 y.o. female who presents for evaluation of left ear pain that has been on and off since January.  She has not noted any hearing problems.  She has had no drainage from the ear. Her PCP thought this would be a neuropathy as she had a normal ear examination and suggested taking Neurontin but the patient did not want to take this medication. She has also had some sinus issues mostly allergies.  She was treated with antibiotics with 1 course of cefdinir 300 mg twice daily for 10 days without improvement. The left ear pain became very intense when she was holding something in her mouth while working on a crown.  Past Medical History:  Diagnosis Date  . Allergy   . Anxiety   . Depression   . Hyperlipidemia   . Hypertension   . Keratosis pilaris    Past Surgical History:  Procedure Laterality Date  . WISDOM TOOTH EXTRACTION     Social History   Socioeconomic History  . Marital status: Married    Spouse name: Not on file  . Number of children: Not on file  . Years of education: Not on file  . Highest education level: Not on file  Occupational History  . Not on file  Tobacco Use  . Smoking status: Never Smoker  . Smokeless tobacco: Never Used  Substance and Sexual Activity  . Alcohol use: Yes    Comment: rarely  . Drug use: No  . Sexual activity: Yes    Birth control/protection: Pill  Other Topics Concern  . Not on file  Social History Narrative  . Not on file   Social Determinants of Health   Financial Resource Strain:   . Difficulty of Paying Living Expenses:   Food Insecurity:   . Worried About Programme researcher, broadcasting/film/video in the Last Year:   . Barista in the Last Year:   Transportation Needs:   . Freight forwarder (Medical):   Marland Kitchen Lack of Transportation (Non-Medical):   Physical Activity:   . Days of Exercise per Week:   . Minutes of Exercise per Session:   Stress:   . Feeling of Stress :   Social Connections:   . Frequency  of Communication with Friends and Family:   . Frequency of Social Gatherings with Friends and Family:   . Attends Religious Services:   . Active Member of Clubs or Organizations:   . Attends Banker Meetings:   Marland Kitchen Marital Status:    Family History  Problem Relation Age of Onset  . Mental illness Maternal Grandmother        schizophrenia  . Cancer Paternal Grandmother        ovarian ,breast,lung  . Breast cancer Paternal Grandmother    Allergies  Allergen Reactions  . Codeine Nausea Only  . Fish Allergy     Eel Only   Prior to Admission medications   Medication Sig Start Date End Date Taking? Authorizing Provider  Cholecalciferol (VITAMIN D3) 2000 units TABS Take 1 tablet by mouth daily.   Yes [provider]  citalopram (CELEXA) 40 MG tablet Take 1 tablet by mouth once daily. 10/18/18  Yes [place], Betty G, MD  diphenoxylate-atropine (LOMOTIL) 2.5-0.025 MG tablet Take 1-2 tablets by mouth every 8 (eight) hours as needed. 04/24/18  Yes [provider]  famotidine (PEPCID) 20 MG tablet Take 20 mg by mouth at bedtime as needed.   Yes [provider]  Fexofenadine HCl (ALLEGRA PO) Take by mouth.   Yes [provider]  meloxicam (MOBIC) 7.5 MG tablet Take 1 tablet (7.5 mg total) by mouth 2 (two) times daily as needed. for pain 03/04/18  Yes [place], Betty G, MD  phentermine (ADIPEX-P) 37.5 MG tablet Take 1 tablet (37.5 mg total) by mouth daily before breakfast. Start 1/2 tab x 7 days then increase to whole tab. 10/18/18  Yes [place], Betty G, MD     Positive ROS: Otherwise negative  All other systems have been reviewed and were otherwise negative with the exception of those mentioned in the HPI and as above.  Physical Exam: Constitutional: Alert, well-appearing, no acute distress Ears: External ears without lesions or tenderness.  The left ear canal and TM are clear to evaluation.  TM has normal mobility on pneumatic otoscopy.  On hearing  screening with the 512 1024 tuning fork she heard about the same in both ears with AC > BC bilaterally. Nasal: External nose without lesions. Septum with mild deformity.  Moderate rhinitis..  After decongesting the nose both middle meatus regions were clear with no evidence of active infection. Oral: Lips and gums without lesions. Tongue and palate mucosa without lesions. Posterior oropharynx clear.  Tonsil regions appear benign bilaterally. Neck: No palpable adenopathy or masses.  She does have some popping in the left TMJ region when opening closing her mouth.  This is not that tender today but the tenderness is in this region.  No adenopathy in the neck and no swelling of the parotid gland. Respiratory: Breathing comfortably  Skin: No facial/neck lesions or rash noted.  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 Chronic allergic rhinitis  Plan: For the ear pain recommended soft diet and use of nonsteroidal anti-inflammatory medication such as Advil 600 mg 2 or 3 times a day for a week to see if this helps resolve the pain.  She already has a splint or a mouth block to use as she has history of grinding her teeth. Reassured her of normal ear examination. For her allergies recommend use of Nasacort which she already has 2 sprays each nostril at night and azelastine 0.151 spray twice daily. Antihistamines and saline rinses as needed.  Radene Journey, MD

## 2019-10-29 ENCOUNTER — Other Ambulatory Visit: Payer: Self-pay

## 2019-10-29 ENCOUNTER — Ambulatory Visit (INDEPENDENT_AMBULATORY_CARE_PROVIDER_SITE_OTHER): Payer: 59 | Admitting: Otolaryngology

## 2019-10-29 VITALS — Temp 97.5°F

## 2019-10-29 DIAGNOSIS — J358 Other chronic diseases of tonsils and adenoids: Secondary | ICD-10-CM

## 2019-10-29 DIAGNOSIS — J3501 Chronic tonsillitis: Secondary | ICD-10-CM

## 2019-10-29 NOTE — Progress Notes (Signed)
HPI: Donna Carey is a 44 y.o. female who presents is referred by Dr. Chanetta Marshall for evaluation of chronic recurrent tonsil pain and history of recurrent tonsil stones especially on the left side.  She extruded a large tonsil stone from the left tonsil several weeks ago.  She has had negative rapid strep test and negative culture.  She has been treated previously with Augmentin. She does not smoke. She does snore but reports no history of OSA.  She has no cardiac problems or pulmonary problems..  Past Medical History:  Diagnosis Date  . Allergy   . Anxiety   . Depression   . Hyperlipidemia   . Hypertension   . Keratosis pilaris    Past Surgical History:  Procedure Laterality Date  . WISDOM TOOTH EXTRACTION     Social History   Socioeconomic History  . Marital status: Married    Spouse name: Not on file  . Number of children: Not on file  . Years of education: Not on file  . Highest education level: Not on file  Occupational History  . Not on file  Tobacco Use  . Smoking status: Never Smoker  . Smokeless tobacco: Never Used  Substance and Sexual Activity  . Alcohol use: Yes    Comment: rarely  . Drug use: No  . Sexual activity: Yes    Birth control/protection: Pill  Other Topics Concern  . Not on file  Social History Narrative  . Not on file   Social Determinants of Health   Financial Resource Strain:   . Difficulty of Paying Living Expenses:   Food Insecurity:   . Worried About Programme researcher, broadcasting/film/video in the Last Year:   . Barista in the Last Year:   Transportation Needs:   . Freight forwarder (Medical):   Marland Kitchen Lack of Transportation (Non-Medical):   Physical Activity:   . Days of Exercise per Week:   . Minutes of Exercise per Session:   Stress:   . Feeling of Stress :   Social Connections:   . Frequency of Communication with Friends and Family:   . Frequency of Social Gatherings with Friends and Family:   . Attends Religious Services:   .  Active Member of Clubs or Organizations:   . Attends Banker Meetings:   Marland Kitchen Marital Status:    Family History  Problem Relation Age of Onset  . Mental illness Maternal Grandmother        schizophrenia  . Cancer Paternal Grandmother        ovarian ,breast,lung  . Breast cancer Paternal Grandmother    Allergies  Allergen Reactions  . Codeine Nausea Only  . Fish Allergy     Eel Only   Prior to Admission medications   Medication Sig Start Date End Date Taking? Authorizing Provider  Cholecalciferol (VITAMIN D3) 2000 units TABS Take 1 tablet by mouth daily.   Yes [provider]  citalopram (CELEXA) 40 MG tablet Take 1 tablet by mouth once daily. 10/18/18  Yes [place], Betty G, MD  diphenoxylate-atropine (LOMOTIL) 2.5-0.025 MG tablet Take 1-2 tablets by mouth every 8 (eight) hours as needed. 04/24/18  Yes [provider]  famotidine (PEPCID) 20 MG tablet Take 20 mg by mouth at bedtime as needed.   Yes [provider]  Fexofenadine HCl (ALLEGRA PO) Take by mouth.   Yes [provider]  meloxicam (MOBIC) 7.5 MG tablet Take 1 tablet (7.5 mg total) by mouth 2 (two)  times daily as needed. for pain 03/04/18  Yes [place], Betty G, MD  phentermine (ADIPEX-P) 37.5 MG tablet Take 1 tablet (37.5 mg total) by mouth daily before breakfast. Start 1/2 tab x 7 days then increase to whole tab. 10/18/18  Yes [place], Betty G, MD     Positive ROS: Otherwise negative  All other systems have been reviewed and were otherwise negative with the exception of those mentioned in the HPI and as above.  Physical Exam: Constitutional: Alert, well-appearing, no acute distress Ears: External ears without lesions or tenderness. Ear canals are clear bilaterally with intact, clear TMs.  Nasal: External nose without lesions. Septum with minimal deformity and mild rhinitis.. Clear nasal passages with no signs of infection. Oral: Lips and gums without lesions. Tongue and palate  mucosa without lesions. Posterior oropharynx clear.  She has moderate sized 2-3+ sized tonsils bilaterally with large tonsillar crypt on the left tonsil.  There is no exudate and no obvious tonsil stones noted on clinical exam in the office today although she does have large tonsillar crypts on the left side. Neck: No palpable adenopathy or masses. Respiratory: Breathing comfortably  Skin: No facial/neck lesions or rash noted.  Procedures  Assessment: Chronic cryptic tonsils with history of frequent tonsil stones.  Moderate tonsillar hypertrophy.  Plan: Briefly reviewed with her concerning oral hygiene and medical treatment of tonsil stones consisting of frequent mouth rinses with either saline or antiseptic mouthwash such as Listerine or scope.  She can also extrude the tonsil stones when she developed some with a Q-tip as needed. Also discussed with her concerning option of tonsillectomy which would consist of outpatient surgery and discussed the morbidity associated with tonsillectomy.  She would have to be out of work for 10-14 days. She will call us back if she decides to schedule tonsillectomy.   Narda Bonds, MD   CC:

## 2019-11-19 ENCOUNTER — Ambulatory Visit (INDEPENDENT_AMBULATORY_CARE_PROVIDER_SITE_OTHER): Payer: 59 | Admitting: Otolaryngology

## 2019-12-21 HISTORY — PX: TONSILLECTOMY: SUR1361

## 2019-12-24 ENCOUNTER — Other Ambulatory Visit: Payer: Self-pay

## 2019-12-24 ENCOUNTER — Encounter (HOSPITAL_BASED_OUTPATIENT_CLINIC_OR_DEPARTMENT_OTHER): Payer: Self-pay | Admitting: Otolaryngology

## 2019-12-27 ENCOUNTER — Ambulatory Visit (INDEPENDENT_AMBULATORY_CARE_PROVIDER_SITE_OTHER): Payer: Self-pay | Admitting: Otolaryngology

## 2019-12-27 DIAGNOSIS — J3501 Chronic tonsillitis: Secondary | ICD-10-CM

## 2019-12-27 NOTE — H&P (View-Only) (Signed)
PREOPERATIVE H&P  Chief Complaint: Chronic tonsil problems  HPI: Donna Carey is a 44 y.o. female who presents for evaluation of chronic tonsil problems.  She has had frequent tonsil stones as well as frequent sore throats.  On exam she has moderate sized cryptic tonsils bilaterally.  She is taken operating room at this time for tonsillectomy.  Past Medical History:  Diagnosis Date  . Allergy   . Anxiety   . Depression   . Hyperlipidemia   . Hypertension   . Keratosis pilaris    Past Surgical History:  Procedure Laterality Date  . WISDOM TOOTH EXTRACTION     Social History   Socioeconomic History  . Marital status: Married    Spouse name: Not on file  . Number of children: Not on file  . Years of education: Not on file  . Highest education level: Not on file  Occupational History  . Not on file  Tobacco Use  . Smoking status: Never Smoker  . Smokeless tobacco: Never Used  Vaping Use  . Vaping Use: Never used  Substance and Sexual Activity  . Alcohol use: Yes    Comment: rarely  . Drug use: No  . Sexual activity: Yes  Other Topics Concern  . Not on file  Social History Narrative  . Not on file   Social Determinants of Health   Financial Resource Strain:   . Difficulty of Paying Living Expenses: Not on file  Food Insecurity:   . Worried About Programme researcher, broadcasting/film/video in the Last Year: Not on file  . Ran Out of Food in the Last Year: Not on file  Transportation Needs:   . Lack of Transportation (Medical): Not on file  . Lack of Transportation (Non-Medical): Not on file  Physical Activity:   . Days of Exercise per Week: Not on file  . Minutes of Exercise per Session: Not on file  Stress:   . Feeling of Stress : Not on file  Social Connections:   . Frequency of Communication with Friends and Family: Not on file  . Frequency of Social Gatherings with Friends and Family: Not on file  . Attends Religious Services: Not on file  . Active Member of Clubs or  Organizations: Not on file  . Attends Banker Meetings: Not on file  . Marital Status: Not on file   Family History  Problem Relation Age of Onset  . Mental illness Maternal Grandmother        schizophrenia  . Cancer Paternal Grandmother        ovarian ,breast,lung  . Breast cancer Paternal Grandmother    Allergies  Allergen Reactions  . Codeine Nausea Only  . Fish Allergy     Eel Only   Prior to Admission medications   Medication Sig Start Date End Date Taking? Authorizing Provider  Cholecalciferol (VITAMIN D3) 2000 units TABS Take 1 tablet by mouth daily.    [provider]  citalopram (CELEXA) 40 MG tablet Take 1 tablet by mouth once daily. 10/18/18   [place], Betty G, MD  Cyanocobalamin (B-12) 2000 MCG TABS Take by mouth.    [provider]  famotidine (PEPCID) 20 MG tablet Take 20 mg by mouth at bedtime as needed.    [provider]  Fexofenadine HCl (ALLEGRA PO) Take by mouth.    [provider]  Triamcinolone Acetonide (NASACORT ALLERGY 24HR NA) Place into the nose.    [provider]     Positive  ROS: Otherwise negative  All other systems have been reviewed and were otherwise negative with the exception of those mentioned in the HPI and as above.  Physical Exam: There were no vitals filed for this visit.  General: Alert, no acute distress Oral: Normal oral mucosa and tonsils 2-3+ bilaterally Nasal: Clear nasal passages Neck: No palpable adenopathy or thyroid nodules Ear: Ear canal is clear with normal appearing TMs Cardiovascular: Regular rate and rhythm, no murmur.  Respiratory: Clear to auscultation Neurologic: Alert and oriented x 3   Assessment/Plan: Chronic tonsillitis  Plan for tonsillectomy   Dillard Cannon, MD 12/27/2019 1:03 PM

## 2019-12-27 NOTE — H&P (Signed)
PREOPERATIVE H&P  Chief Complaint: Chronic tonsil problems  HPI: Donna Carey is a 44 y.o. female who presents for evaluation of chronic tonsil problems.  She has had frequent tonsil stones as well as frequent sore throats.  On exam she has moderate sized cryptic tonsils bilaterally.  She is taken operating room at this time for tonsillectomy.  Past Medical History:  Diagnosis Date  . Allergy   . Anxiety   . Depression   . Hyperlipidemia   . Hypertension   . Keratosis pilaris    Past Surgical History:  Procedure Laterality Date  . WISDOM TOOTH EXTRACTION     Social History   Socioeconomic History  . Marital status: Married    Spouse name: Not on file  . Number of children: Not on file  . Years of education: Not on file  . Highest education level: Not on file  Occupational History  . Not on file  Tobacco Use  . Smoking status: Never Smoker  . Smokeless tobacco: Never Used  Vaping Use  . Vaping Use: Never used  Substance and Sexual Activity  . Alcohol use: Yes    Comment: rarely  . Drug use: No  . Sexual activity: Yes  Other Topics Concern  . Not on file  Social History Narrative  . Not on file   Social Determinants of Health   Financial Resource Strain:   . Difficulty of Paying Living Expenses: Not on file  Food Insecurity:   . Worried About Running Out of Food in the Last Year: Not on file  . Ran Out of Food in the Last Year: Not on file  Transportation Needs:   . Lack of Transportation (Medical): Not on file  . Lack of Transportation (Non-Medical): Not on file  Physical Activity:   . Days of Exercise per Week: Not on file  . Minutes of Exercise per Session: Not on file  Stress:   . Feeling of Stress : Not on file  Social Connections:   . Frequency of Communication with Friends and Family: Not on file  . Frequency of Social Gatherings with Friends and Family: Not on file  . Attends Religious Services: Not on file  . Active Member of Clubs or  Organizations: Not on file  . Attends Club or Organization Meetings: Not on file  . Marital Status: Not on file   Family History  Problem Relation Age of Onset  . Mental illness Maternal Grandmother        schizophrenia  . Cancer Paternal Grandmother        ovarian ,breast,lung  . Breast cancer Paternal Grandmother    Allergies  Allergen Reactions  . Codeine Nausea Only  . Fish Allergy     Eel Only   Prior to Admission medications   Medication Sig Start Date End Date Taking? Authorizing Provider  Cholecalciferol (VITAMIN D3) 2000 units TABS Take 1 tablet by mouth daily.    [provider]  citalopram (CELEXA) 40 MG tablet Take 1 tablet by mouth once daily. 10/18/18   [place], Betty G, MD  Cyanocobalamin (B-12) 2000 MCG TABS Take by mouth.    [provider]  famotidine (PEPCID) 20 MG tablet Take 20 mg by mouth at bedtime as needed.    [provider]  Fexofenadine HCl (ALLEGRA PO) Take by mouth.    [provider]  Triamcinolone Acetonide (NASACORT ALLERGY 24HR NA) Place into the nose.    [provider]     Positive   ROS: Otherwise negative  All other systems have been reviewed and were otherwise negative with the exception of those mentioned in the HPI and as above.  Physical Exam: There were no vitals filed for this visit.  General: Alert, no acute distress Oral: Normal oral mucosa and tonsils 2-3+ bilaterally Nasal: Clear nasal passages Neck: No palpable adenopathy or thyroid nodules Ear: Ear canal is clear with normal appearing TMs Cardiovascular: Regular rate and rhythm, no murmur.  Respiratory: Clear to auscultation Neurologic: Alert and oriented x 3   Assessment/Plan: Chronic tonsillitis  Plan for tonsillectomy   Dillard Cannon, MD 12/27/2019 1:03 PM

## 2019-12-28 ENCOUNTER — Other Ambulatory Visit (HOSPITAL_COMMUNITY)
Admission: RE | Admit: 2019-12-28 | Discharge: 2019-12-28 | Disposition: A | Discharge status: Home / Self Care | Payer: 59 | Source: Ambulatory Visit | Attending: Otolaryngology | Admitting: Otolaryngology

## 2019-12-28 DIAGNOSIS — Z01818 Encounter for other preprocedural examination: Secondary | ICD-10-CM | POA: Diagnosis present

## 2019-12-28 DIAGNOSIS — Z20822 Contact with and (suspected) exposure to covid-19: Secondary | ICD-10-CM | POA: Insufficient documentation

## 2019-12-28 LAB — SARS CORONAVIRUS 2 (TAT 6-24 HRS): SARS Coronavirus 2: NEGATIVE

## 2020-01-01 ENCOUNTER — Encounter (HOSPITAL_BASED_OUTPATIENT_CLINIC_OR_DEPARTMENT_OTHER): Payer: Self-pay | Admitting: Otolaryngology

## 2020-01-01 ENCOUNTER — Ambulatory Visit (HOSPITAL_BASED_OUTPATIENT_CLINIC_OR_DEPARTMENT_OTHER): Payer: 59 | Admitting: Certified Registered"

## 2020-01-01 ENCOUNTER — Other Ambulatory Visit: Payer: Self-pay

## 2020-01-01 ENCOUNTER — Ambulatory Visit (HOSPITAL_BASED_OUTPATIENT_CLINIC_OR_DEPARTMENT_OTHER)
Admission: RE | Admit: 2020-01-01 | Discharge: 2020-01-01 | Disposition: A | Discharge status: Home / Self Care | Payer: 59 | Attending: Otolaryngology | Admitting: Otolaryngology

## 2020-01-01 ENCOUNTER — Encounter (HOSPITAL_BASED_OUTPATIENT_CLINIC_OR_DEPARTMENT_OTHER)
Admission: RE | Disposition: A | Discharge status: Home / Self Care | Payer: Self-pay | Source: Home / Self Care | Attending: Otolaryngology

## 2020-01-01 DIAGNOSIS — I10 Essential (primary) hypertension: Secondary | ICD-10-CM | POA: Insufficient documentation

## 2020-01-01 DIAGNOSIS — F32A Depression, unspecified: Secondary | ICD-10-CM | POA: Insufficient documentation

## 2020-01-01 DIAGNOSIS — F419 Anxiety disorder, unspecified: Secondary | ICD-10-CM | POA: Diagnosis not present

## 2020-01-01 DIAGNOSIS — J3501 Chronic tonsillitis: Secondary | ICD-10-CM | POA: Insufficient documentation

## 2020-01-01 DIAGNOSIS — Z818 Family history of other mental and behavioral disorders: Secondary | ICD-10-CM | POA: Diagnosis not present

## 2020-01-01 DIAGNOSIS — Z885 Allergy status to narcotic agent status: Secondary | ICD-10-CM | POA: Insufficient documentation

## 2020-01-01 DIAGNOSIS — Z79899 Other long term (current) drug therapy: Secondary | ICD-10-CM | POA: Insufficient documentation

## 2020-01-01 DIAGNOSIS — E785 Hyperlipidemia, unspecified: Secondary | ICD-10-CM | POA: Insufficient documentation

## 2020-01-01 DIAGNOSIS — J309 Allergic rhinitis, unspecified: Secondary | ICD-10-CM

## 2020-01-01 HISTORY — PX: TONSILLECTOMY: SHX5217

## 2020-01-01 LAB — POCT PREGNANCY, URINE: Preg Test, Ur: NEGATIVE

## 2020-01-01 SURGERY — TONSILLECTOMY
Anesthesia: General | Laterality: Bilateral

## 2020-01-01 MED ORDER — FENTANYL CITRATE (PF) 100 MCG/2ML IJ SOLN
INTRAMUSCULAR | Status: AC
  Filled 2020-01-01: qty 2

## 2020-01-01 MED ORDER — ROCURONIUM BROMIDE 10 MG/ML (PF) SYRINGE
PREFILLED_SYRINGE | INTRAVENOUS | Status: AC
  Filled 2020-01-01: qty 10

## 2020-01-01 MED ORDER — CEFAZOLIN SODIUM-DEXTROSE 2-3 GM-%(50ML) IV SOLR
INTRAVENOUS | Status: DC | PRN
  Administered 2020-01-01: 2 g via INTRAVENOUS

## 2020-01-01 MED ORDER — ONDANSETRON HCL 4 MG/2ML IJ SOLN
INTRAMUSCULAR | Status: AC
  Filled 2020-01-01: qty 2

## 2020-01-01 MED ORDER — DEXAMETHASONE SODIUM PHOSPHATE 4 MG/ML IJ SOLN
INTRAMUSCULAR | Status: DC | PRN
  Administered 2020-01-01: 10 mg via INTRAVENOUS

## 2020-01-01 MED ORDER — PROPOFOL 10 MG/ML IV BOLUS
INTRAVENOUS | Status: DC | PRN
  Administered 2020-01-01: 200 mg via INTRAVENOUS

## 2020-01-01 MED ORDER — MIDAZOLAM HCL 2 MG/2ML IJ SOLN
INTRAMUSCULAR | Status: AC
  Filled 2020-01-01: qty 2

## 2020-01-01 MED ORDER — ONDANSETRON HCL 4 MG/2ML IJ SOLN: 4.0000 mg | Freq: Once | INTRAMUSCULAR | Status: DC | PRN

## 2020-01-01 MED ORDER — LIDOCAINE 2% (20 MG/ML) 5 ML SYRINGE
INTRAMUSCULAR | Status: AC
  Filled 2020-01-01: qty 5

## 2020-01-01 MED ORDER — HYDROCODONE-ACETAMINOPHEN 7.5-325 MG/15ML PO SOLN: 10.0000 mL | Freq: Four times a day (QID) | ORAL | 0 refills | Status: DC | PRN

## 2020-01-01 MED ORDER — ROCURONIUM BROMIDE 100 MG/10ML IV SOLN
INTRAVENOUS | Status: DC | PRN
  Administered 2020-01-01: 60 mg via INTRAVENOUS

## 2020-01-01 MED ORDER — AMOXICILLIN 250 MG/5ML PO SUSR: 500.0000 mg | Freq: Two times a day (BID) | ORAL | 0 refills | Status: DC

## 2020-01-01 MED ORDER — ONDANSETRON HCL 4 MG/2ML IJ SOLN
INTRAMUSCULAR | Status: DC | PRN
  Administered 2020-01-01: 4 mg via INTRAVENOUS

## 2020-01-01 MED ORDER — SUGAMMADEX SODIUM 200 MG/2ML IV SOLN
INTRAVENOUS | Status: DC | PRN
  Administered 2020-01-01: 200 mg via INTRAVENOUS

## 2020-01-01 MED ORDER — DEXAMETHASONE SODIUM PHOSPHATE 10 MG/ML IJ SOLN
INTRAMUSCULAR | Status: AC
  Filled 2020-01-01: qty 1

## 2020-01-01 MED ORDER — PROPOFOL 10 MG/ML IV BOLUS
INTRAVENOUS | Status: AC
  Filled 2020-01-01: qty 40

## 2020-01-01 MED ORDER — FENTANYL CITRATE (PF) 100 MCG/2ML IJ SOLN
INTRAMUSCULAR | Status: DC | PRN
Start: 2020-01-01 — End: 2020-01-01
  Administered 2020-01-01: 100 ug via INTRAVENOUS

## 2020-01-01 MED ORDER — LIDOCAINE HCL (CARDIAC) PF 100 MG/5ML IV SOSY
PREFILLED_SYRINGE | INTRAVENOUS | Status: DC | PRN
  Administered 2020-01-01: 100 mg via INTRAVENOUS

## 2020-01-01 MED ORDER — FENTANYL CITRATE (PF) 100 MCG/2ML IJ SOLN: 25.0000 ug | INTRAMUSCULAR | Status: DC | PRN

## 2020-01-01 MED ORDER — MIDAZOLAM HCL 5 MG/5ML IJ SOLN
INTRAMUSCULAR | Status: DC | PRN
  Administered 2020-01-01: 2 mg via INTRAVENOUS

## 2020-01-01 MED ORDER — CEFAZOLIN SODIUM-DEXTROSE 2-4 GM/100ML-% IV SOLN
INTRAVENOUS | Status: AC
  Filled 2020-01-01: qty 100

## 2020-01-01 MED ORDER — LACTATED RINGERS IV SOLN: INTRAVENOUS | Status: DC | PRN

## 2020-01-01 SURGICAL SUPPLY — 27 items
BNDG COHESIVE 2X5 TAN STRL LF (GAUZE/BANDAGES/DRESSINGS) IMPLANT
CANISTER SUCT 1200ML W/VALVE (MISCELLANEOUS) ×2 IMPLANT
CATH ROBINSON RED A/P 12FR (CATHETERS) IMPLANT
COAGULATOR SUCT SWTCH 10FR 6 (ELECTROSURGICAL) IMPLANT
COVER BACK TABLE 60X90IN (DRAPES) ×2 IMPLANT
COVER MAYO STAND STRL (DRAPES) ×2 IMPLANT
COVER WAND RF STERILE (DRAPES) IMPLANT
ELECT COATED BLADE 2.86 ST (ELECTRODE) ×2 IMPLANT
ELECT REM PT RETURN 9FT ADLT (ELECTROSURGICAL) ×2
ELECT REM PT RETURN 9FT PED (ELECTROSURGICAL)
ELECTRODE REM PT RETRN 9FT PED (ELECTROSURGICAL) IMPLANT
ELECTRODE REM PT RTRN 9FT ADLT (ELECTROSURGICAL) IMPLANT
GAUZE SPONGE 4X4 12PLY STRL LF (GAUZE/BANDAGES/DRESSINGS) ×2 IMPLANT
GLOVE SS BIOGEL STRL SZ 7.5 (GLOVE) ×1 IMPLANT
GLOVE SUPERSENSE BIOGEL SZ 7.5 (GLOVE) ×2
GOWN STRL REUS W/ TWL LRG LVL3 (GOWN DISPOSABLE) ×1 IMPLANT
GOWN STRL REUS W/TWL LRG LVL3 (GOWN DISPOSABLE) ×2
MARKER SKIN DUAL TIP RULER LAB (MISCELLANEOUS) IMPLANT
NS IRRIG 1000ML POUR BTL (IV SOLUTION) ×2 IMPLANT
PENCIL FOOT CONTROL (ELECTRODE) ×2 IMPLANT
SHEET MEDIUM DRAPE 40X70 STRL (DRAPES) ×2 IMPLANT
SOLUTION BUTLER CLEAR DIP (MISCELLANEOUS) ×1 IMPLANT
SPONGE TONSIL TAPE 1 RFD (DISPOSABLE) IMPLANT
SPONGE TONSIL TAPE 1.25 RFD (DISPOSABLE) ×1 IMPLANT
SYR BULB EAR ULCER 3OZ GRN STR (SYRINGE) ×2 IMPLANT
TOWEL GREEN STERILE FF (TOWEL DISPOSABLE) ×2 IMPLANT
TUBE CONNECTING 20X1/4 (TUBING) ×2 IMPLANT

## 2020-01-01 NOTE — Anesthesia Postprocedure Evaluation (Signed)
Anesthesia Post Note  Patient: Trilby Drummer  Procedure(s) Performed: TONSILLECTOMY (Bilateral )     Patient location during evaluation: PACU Anesthesia Type: General Level of consciousness: awake and alert Pain management: pain level controlled Vital Signs Assessment: post-procedure vital signs reviewed and stable Respiratory status: spontaneous breathing, nonlabored ventilation, respiratory function stable and patient connected to nasal cannula oxygen Cardiovascular status: blood pressure returned to baseline and stable Postop Assessment: no apparent nausea or vomiting Anesthetic complications: no   No complications documented.  Last Vitals:  Vitals:   01/01/20 0845 01/01/20 0900  BP: (!) 151/87 (!) 143/94  Pulse: 74 73  Resp: 18 20  Temp:  (!) 36.2 C  SpO2: 100% 100%    Last Pain:  Vitals:   01/01/20 0900  TempSrc:   PainSc: 4                  Mathis Cashman COKER

## 2020-01-01 NOTE — Anesthesia Procedure Notes (Signed)
Procedure Name: Intubation Performed by: Verita Lamb, CRNA Pre-anesthesia Checklist: Patient identified, Emergency Drugs available, Suction available and Patient being monitored Patient Re-evaluated:Patient Re-evaluated prior to induction Oxygen Delivery Method: Circle system utilized Preoxygenation: Pre-oxygenation with 100% oxygen Induction Type: IV induction Ventilation: Mask ventilation without difficulty Laryngoscope Size: Mac and 4 Grade View: Grade I Tube type: Oral Tube size: 7.0 mm Number of attempts: 1 Airway Equipment and Method: Stylet and Oral airway Placement Confirmation: ETT inserted through vocal cords under direct vision,  positive ETCO2,  breath sounds checked- equal and bilateral and CO2 detector Secured at: 21 cm Tube secured with: Tape Dental Injury: Teeth and Oropharynx as per pre-operative assessment  Comments: Lips and teeth as preop, easy mask ventilation

## 2020-01-01 NOTE — Interval H&P Note (Signed)
History and Physical Interval Note:  01/01/2020 7:26 AM  Donna Carey  has presented today for surgery, with the diagnosis of CHRONIC TONSILITIS.  The various methods of treatment have been discussed with the patient and family. After consideration of risks, benefits and other options for treatment, the patient has consented to  Procedure(s): TONSILLECTOMY (Bilateral) as a surgical intervention.  The patient's history has been reviewed, patient examined, no change in status, stable for surgery.  I have reviewed the patient's chart and labs.  Questions were answered to the patient's satisfaction.     Dillard Cannon

## 2020-01-01 NOTE — Anesthesia Preprocedure Evaluation (Signed)
Anesthesia Evaluation  Patient identified by MRN, date of birth, ID band Patient awake    Reviewed: Allergy & Precautions, NPO status , Patient's Chart, lab work & pertinent test results  Airway Mallampati: II  TM Distance: >3 FB Neck ROM: Full    Dental  (+) Teeth Intact, Dental Advisory Given   Pulmonary    breath sounds clear to auscultation       Cardiovascular hypertension,  Rhythm:Regular Rate:Normal     Neuro/Psych    GI/Hepatic   Endo/Other    Renal/GU      Musculoskeletal   Abdominal   Peds  Hematology   Anesthesia Other Findings   Reproductive/Obstetrics                             Anesthesia Physical Anesthesia Plan  ASA: II  Anesthesia Plan: General   Post-op Pain Management:    Induction: Intravenous  PONV Risk Score and Plan: Ondansetron and Dexamethasone  Airway Management Planned: Oral ETT  Additional Equipment:   Intra-op Plan:   Post-operative Plan: Extubation in OR  Informed Consent: I have reviewed the patients History and Physical, chart, labs and discussed the procedure including the risks, benefits and alternatives for the proposed anesthesia with the patient or authorized representative who has indicated his/her understanding and acceptance.     Dental advisory given  Plan Discussed with: CRNA and Anesthesiologist  Anesthesia Plan Comments:         Anesthesia Quick Evaluation  

## 2020-01-01 NOTE — Op Note (Signed)
NAME: Donna Carey, Donna Carey MEDICAL RECORD DQ:[id number] ACCOUNT [id number] DATE OF BIRTH:1975/06/26 FACILITY: [facility] LOCATION: [facility] PHYSICIAN:Espyn Radwan Braxton Feathers, MD  OPERATIVE REPORT  DATE OF PROCEDURE:  01/01/2020  PREOPERATIVE DIAGNOSIS:  Chronic tonsillitis.  POSTOPERATIVE DIAGNOSIS:  Chronic tonsillitis.  OPERATION PERFORMED:  Tonsillectomy.  SURGEON:  Dillard Cannon, MD.  ANESTHESIA:  General endotracheal.  ESTIMATED BLOOD LOSS:  Minimal.  COMPLICATIONS:  None.  BRIEF CLINICAL NOTE:  The patient is a 44 year old female who has had frequent tonsil stones as well as sore throats.  On exam, she has moderate sized cryptic tonsils bilaterally.  She is taken to the operating room at this time for tonsillectomy.  DESCRIPTION OF PROCEDURE:  After adequate endotracheal anesthesia, the patient received 2 grams of Ancef and 10 mg of Decadron IV preoperatively.  A mouth gag was used to expose the oropharynx.  The left and right tonsils were resected from the tonsillar  fossa using cautery.  Care was taken to preserve the uvula as well as anterior and posterior tonsillar pillars.  Hemostasis was obtained with cautery and oropharynx was irrigated with saline.  Nasopharynx was otherwise clear.  This completed the  procedure.  The patient was awoken from anesthesia and transferred to recovery room, postoperatively doing well.  DISPOSITION:  She is discharged home on amoxicillin suspension 500 mg b.i.d. for [redacted] week along with Tylenol, ibuprofen and hydrocodone elixir 10-15 mL q. 6 hours p.r.n. pain.  She will follow up in my office in 10-14 days for recheck.  IN/NUANCE  D:01/01/2020 T:01/01/2020 JOB:[id number]

## 2020-01-01 NOTE — Brief Op Note (Signed)
01/01/2020  8:10 AM  PATIENT:  Donna Carey  44 y.o. female  PRE-OPERATIVE DIAGNOSIS:  CHRONIC TONSILITIS  POST-OPERATIVE DIAGNOSIS:  CHRONIC TONSILITIS  PROCEDURE:  Procedure(s): TONSILLECTOMY (Bilateral)  SURGEON:  Surgeon(s) and Role:    Drema Halon, MD - Primary  PHYSICIAN ASSISTANT:   ASSISTANTS: none   ANESTHESIA:   general  EBL:  minimal   BLOOD ADMINISTERED:none  DRAINS: none   LOCAL MEDICATIONS USED:  NONE  SPECIMEN:  No Specimen  DISPOSITION OF SPECIMEN:  N/A  COUNTS:  YES  TOURNIQUET:  * No tourniquets in log *  DICTATION: .Other Dictation: Dictation Number [id number]  PLAN OF CARE: Discharge to home after PACU  PATIENT DISPOSITION:  PACU - hemodynamically stable.   Delay start of Pharmacological VTE agent (>24hrs) due to surgical blood loss or risk of bleeding: yes

## 2020-01-01 NOTE — Discharge Instructions (Addendum)
Instructions for Home Care After Tonsillectomy  First Day Home: Encourage fluid intake by frequently offering liquids, soup, ice cream jello, etc.  Drink several glasses of water.  Cooler fluids are best.  Avoid hot and highly seasoned foods.  Orange juice, grapefruit juice and tomato juice may cause stinging sensation because of their acidic content.    Second and Third Day Home: Continue liquids and add soft foods, (pudding, macaroni and cheese, mashed potatoes, soft scrambled eggs, etc.).  Make sure you drink plenty of liquids so you do not get dehydrated.  Fifth Thru Seventh Day Home: Gradually resume a normal diet, but avoid hot foods, potato chips, nuts, toast and crackers until 2 weeks after surgery.  General Instructions   No undue physical exertion or exercise for one week.  Children: Tylenol may be used for discomfort and/or fever.  Use as often as necessary within limits of the directions.  Adults: May spray throat with Chloroseptic or other topical anesthetic for discomfort and use pain medication obtained by prescription as directed.    A slight fever (up to 101) is expected for the first the first couple of days.  Take Tylenol (or aspirin substitute) as directed.  Pain in ears is common after tonsillectomy.  It represents pain referred from the throat where the tonsils were removed.  There is usually nothing wrong with the ears in most cases.  Administer Tylenol as needed to control this pain.  White patches will form where the tonsils were removed.  This is perfectly normal.  They will disappear in one to two weeks.  Mouth odor may be notice during the healing stage.  In a very small percentage of people, there is some bleeding after five to six days.  If this happens, do not become excited, for the bleeding is usually light.  Be quiet, lie down, and spit the blood out gently.  Gargle the throat with ice water.  If the bleeding does not stop promptly, call the office  [phone]), which answers 24 hours a day.  A follow up appointment should be made with Dr. Ezzard Standing 10-14 days following surgery. Please call [phone] for the appointment time.    Tylenol, ibuprofen or hydrocodone elixir 10-15 cc every 6 hrs prn pain Amoxicillin susp 500 mg bid for 1 week Topical spray or throat lozenges for sore throat  prn will also help with throat pain Make sure you drink plenty of liquids   Post Anesthesia Home Care Instructions  Activity: Get plenty of rest for the remainder of the day. A responsible individual must stay with you for 24 hours following the procedure.  For the next 24 hours, DO NOT: -Drive a car -Advertising copywriter -Drink alcoholic beverages -Take any medication unless instructed by your physician -Make any legal decisions or sign important papers.  Meals: Start with liquid foods such as gelatin or soup. Progress to regular foods as tolerated. Avoid greasy, spicy, heavy foods. If nausea and/or vomiting occur, drink only clear liquids until the nausea and/or vomiting subsides. Call your physician if vomiting continues.  Special Instructions/Symptoms: Your throat may feel dry or sore from the anesthesia or the breathing tube placed in your throat during surgery. If this causes discomfort, gargle with warm salt water. The discomfort should disappear within 24 hours.  If you had a scopolamine patch placed behind your ear for the management of post- operative nausea and/or vomiting:  1. The medication in the patch is effective for 72 hours, after which it should be  removed.  Wrap patch in a tissue and discard in the trash. Wash hands thoroughly with soap and water. 2. You may remove the patch earlier than 72 hours if you experience unpleasant side effects which may include dry mouth, dizziness or visual disturbances. 3. Avoid touching the patch. Wash your hands with soap and water after contact with the patch.

## 2020-01-01 NOTE — Transfer of Care (Signed)
Immediate Anesthesia Transfer of Care Note  Patient: Trilby Drummer  Procedure(s) Performed: TONSILLECTOMY (Bilateral )  Patient Location: PACU  Anesthesia Type:General  Level of Consciousness: awake, alert  and oriented  Airway & Oxygen Therapy: Patient Spontanous Breathing and Patient connected to face mask oxygen  Post-op Assessment: Report given to RN and Post -op Vital signs reviewed and stable  Post vital signs: Reviewed and stable  Last Vitals:  Vitals Value Taken Time  BP 129/66 01/01/20 0821  Temp    Pulse 91 01/01/20 0822  Resp 16 01/01/20 0822  SpO2 100 % 01/01/20 0822  Vitals shown include unvalidated device data.  Last Pain:  Vitals:   01/01/20 0651  TempSrc: Oral  PainSc: 0-No pain      Patients Stated Pain Goal: 2 (01/01/20 9470)  Complications: No complications documented.

## 2020-01-02 ENCOUNTER — Encounter (HOSPITAL_BASED_OUTPATIENT_CLINIC_OR_DEPARTMENT_OTHER): Payer: Self-pay | Admitting: Otolaryngology

## 2020-01-09 ENCOUNTER — Telehealth (INDEPENDENT_AMBULATORY_CARE_PROVIDER_SITE_OTHER): Payer: Self-pay

## 2020-01-11 ENCOUNTER — Other Ambulatory Visit: Payer: Self-pay

## 2020-01-11 ENCOUNTER — Ambulatory Visit (INDEPENDENT_AMBULATORY_CARE_PROVIDER_SITE_OTHER): Payer: 59 | Admitting: Otolaryngology

## 2020-01-11 DIAGNOSIS — Z4889 Encounter for other specified surgical aftercare: Secondary | ICD-10-CM

## 2020-01-11 NOTE — Progress Notes (Signed)
HPI: Donna Carey is a 44 y.o. female who presents 10 days s/p tonsillectomy.  She is gradually doing better but has more pain on the left side.  She apparently has some bleeding from the left side 2 days ago but has had no further bleeding..   Past Medical History:  Diagnosis Date   Allergy    Anxiety    Depression    Hyperlipidemia    Hypertension    Keratosis pilaris    Past Surgical History:  Procedure Laterality Date   TONSILLECTOMY Bilateral 01/01/2020   Procedure: TONSILLECTOMY;  Surgeon: Drema Halon, MD;  Location: [facility];  Service: ENT;  Laterality: Bilateral;   WISDOM TOOTH EXTRACTION     Social History   Socioeconomic History   Marital status: Married    Spouse name: Not on file   Number of children: Not on file   Years of education: Not on file   Highest education level: Not on file  Occupational History   Not on file  Tobacco Use   Smoking status: Never Smoker   Smokeless tobacco: Never Used  Vaping Use   Vaping Use: Never used  Substance and Sexual Activity   Alcohol use: Yes    Comment: rarely   Drug use: No   Sexual activity: Yes  Other Topics Concern   Not on file  Social History Narrative   Not on file   Social Determinants of Health   Financial Resource Strain:    Difficulty of Paying Living Expenses: Not on file  Food Insecurity:    Worried About Running Out of Food in the Last Year: Not on file   Ran Out of Food in the Last Year: Not on file  Transportation Needs:    Lack of Transportation (Medical): Not on file   Lack of Transportation (Non-Medical): Not on file  Physical Activity:    Days of Exercise per Week: Not on file   Minutes of Exercise per Session: Not on file  Stress:    Feeling of Stress : Not on file  Social Connections:    Frequency of Communication with Friends and Family: Not on file   Frequency of Social Gatherings with Friends and Family: Not on  file   Attends Religious Services: Not on file   Active Member of Clubs or Organizations: Not on file   Attends Banker Meetings: Not on file   Marital Status: Not on file   Family History  Problem Relation Age of Onset   Mental illness Maternal Grandmother        schizophrenia   Cancer Paternal Grandmother        ovarian ,breast,lung   Breast cancer Paternal Grandmother    Allergies  Allergen Reactions   Codeine Nausea Only   Fish Allergy     Eel Only   Prior to Admission medications   Medication Sig Start Date End Date Taking? Authorizing Provider  amoxicillin (AMOXIL) 250 MG/5ML suspension Take 10 mLs (500 mg total) by mouth 2 (two) times daily. 01/01/20   Drema Halon, MD  Cholecalciferol (VITAMIN D3) 2000 units TABS Take 1 tablet by mouth daily.    [provider]  citalopram (CELEXA) 40 MG tablet Take 1 tablet by mouth once daily. 10/18/18   [place], Betty G, MD  Cyanocobalamin (B-12) 2000 MCG TABS Take by mouth.    [provider]  famotidine (PEPCID) 20 MG tablet Take 20 mg by mouth at bedtime as needed.  [provider]  Fexofenadine HCl (ALLEGRA PO) Take by mouth.    [provider]  HYDROcodone-acetaminophen (HYCET) 7.5-325 mg/15 ml solution Take 10-15 mLs by mouth every 6 (six) hours as needed for moderate pain. 01/01/20 12/31/20  Drema Halon, MD  Triamcinolone Acetonide (NASACORT ALLERGY 24HR NA) Place into the nose.    [provider]     Physical Exam: Tonsil fossa's are clear bilaterally and the seem to be healing nicely.  There is no blood clot and cannot identify a site of origin of the bleeding.   Assessment: S/p tonsillectomy  Plan: She should be okay to return to work next week.  She will notify us if she has any further bleeding but tonsil fossa appear to be doing nicely today on exam.   Narda Bonds, MD

## 2020-01-14 ENCOUNTER — Encounter (INDEPENDENT_AMBULATORY_CARE_PROVIDER_SITE_OTHER): Payer: Self-pay

## 2020-01-14 NOTE — Progress Notes (Signed)
Note to return back to work from [employer] on 01/01/2020.

## 2020-01-16 ENCOUNTER — Telehealth (INDEPENDENT_AMBULATORY_CARE_PROVIDER_SITE_OTHER): Payer: Self-pay

## 2020-08-01 ENCOUNTER — Ambulatory Visit (INDEPENDENT_AMBULATORY_CARE_PROVIDER_SITE_OTHER): Payer: 59 | Admitting: Allergy & Immunology

## 2020-08-01 ENCOUNTER — Other Ambulatory Visit: Payer: Self-pay

## 2020-08-01 ENCOUNTER — Encounter: Payer: Self-pay | Admitting: Allergy & Immunology

## 2020-08-01 VITALS — BP 124/86 | HR 80 | Temp 97.7°F | Resp 18 | Ht 69.5 in | Wt 256.2 lb

## 2020-08-01 DIAGNOSIS — J302 Other seasonal allergic rhinitis: Secondary | ICD-10-CM

## 2020-08-01 DIAGNOSIS — J3089 Other allergic rhinitis: Secondary | ICD-10-CM

## 2020-08-01 DIAGNOSIS — B999 Unspecified infectious disease: Secondary | ICD-10-CM

## 2020-08-01 DIAGNOSIS — K9049 Malabsorption due to intolerance, not elsewhere classified: Secondary | ICD-10-CM

## 2020-08-01 MED ORDER — XHANCE 93 MCG/ACT NA EXHU: INHALANT_SUSPENSION | NASAL | 5 refills | Status: DC

## 2020-08-01 NOTE — Progress Notes (Signed)
NEW PATIENT  Date of Service/Encounter:  08/01/20  Consult requested by: Shon Haleimberlake, Kathryn S, MD   Assessment:   Seasonal and perennial allergic rhinitis (grasses, ragweed, weeds, trees, indoor molds, dust mites, dog and cockroach)  Food intolerance  Morning fatigue - consider sleep study?  Recurrent infections  Plan/Recommendations:   1. Seasonal and perennial allergic rhinitis - Testing today showed: grasses, ragweed, weeds, trees, indoor molds, dust mites, dog and cockroach - Copy of test results provided.  - Avoidance measures provided. - Stop taking: Nasacort - Continue with: Allegra (fexofenadine) 180mg  table once daily and Astelin (azelastine) 2 sprays per nostril 1-2 times daily as needed - Start taking: Xhance (fluticasone) 1-2 sprays per nostril twice daily - You can use an extra dose of the antihistamine, if needed, for breakthrough symptoms.  - Consider nasal saline rinses 1-2 times daily to remove allergens from the nasal cavities as well as help with mucous clearance (this is especially helpful to do before the nasal sprays are given) - Consider allergy shots as a means of long-term control. - Allergy shots "re-train" and "reset" the immune system to ignore environmental allergens and decrease the resulting immune response to those allergens (sneezing, itchy watery eyes, runny nose, nasal congestion, etc).    - Allergy shots improve symptoms in 75-85% of patients.  - We can discuss more at the next appointment if the medications are not working for you.  2. Food intolerance - Testing to the entire panel was negative.  - Copy of testing results provided. - There is a the low positive predictive value of food allergy testing and hence the high possibility of false positives. - In contrast, food allergy testing has a high negative predictive value, therefore if testing is negative we can be relatively assured that they are indeed negative.   3. Recurrent  infections - We will obtain some screening labs to evaluate your immune system.  - Labs to evaluate the quantitative [facility](HOW MUCH) aspects of your immune system: IgG/IgA/IgM, CBC with differential - Labs to evaluate the qualitative (HOW WELL THEY WORK) aspects of your immune system: CH50, Pneumococcal titers, Tetanus titers, Diphtheria titers - We may consider immunizations with Pneumovax and Tdap to challenge your immune system, and then obtain repeat titers in 4-6 weeks.   4. Return in about 4 weeks (around 08/29/2020).    This note in its entirety was forwarded to the Provider who requested this consultation.  Subjective:   Donna Carey is a 45 y.o. female presenting today for evaluation of  Chief Complaint  Patient presents with  . Allergy Testing     Foods and environmental     Donna Carey has a history of the following: Patient Active Problem List   Diagnosis Date Noted  . Class 2 obesity with body mass index (BMI) of 37.0 to 37.9 in adult 03/03/2018  . Osteoarthritis of both knees 12/19/2017  . Gastroesophageal reflux disease 12/19/2017  . Allergic rhinitis 09/15/2016  . Hypertension, essential, benign 09/15/2016  . Depression, major, single episode, moderate (HCC) 09/15/2016  . Vitamin D deficiency, unspecified 09/15/2016    History obtained from: chart review and patient.  Marithza Odie Seralicia Haggar was referred by Shon Haleimberlake, Kathryn S, MD.     Donna Carey is a 45 y.o. female presenting for an evaluation of food and environmental allergies.   Allergic Rhinitis Symptom History: She had her tonsils taken out last year. She has environmental allergies that she treats with Allegra and Nasacort. She has started  Astelin which helps but even with the 3 of them she continues to get sinus infections regularly. When she saw Dr. Willa Rough, she was diagnosed with mold and mildew allergy. She has been limping around with OTC medications because she is miserable all of the  time. She never had allergy testing. She has had sinus infections 3 times this year. She gets ear infections as well. She has tried [employer], Nasacort, Flonase, Nasoenx, and Astelin.   She does have a history of multiple infections, upwards of 3-4 per year. These are mostly sinus infections. She has never required the use of IV antibiotics and has never been hospitalized for her symptoms.   Food Allergy Symptom History: She has a history of IBS and is worried that this is related to food allergies. She reports that within the last year she had her diagnosis with IBS made. She had a large workup which seemed to be a diagnosis of exclusion.   Eczema Symptom History: She has a history of keratosis pilaris. She has never had a diagnosis of eczema.   She does have a HEPA filter in the bedroom. She sleeps heavy but always feels tired. She has never had a sleep study.   Otherwise, there is no history of other atopic diseases, including asthma, drug allergies, stinging insect allergies, eczema, urticaria or contact dermatitis. There is no significant infectious history. Vaccinations are up to date.    Past Medical History: Patient Active Problem List   Diagnosis Date Noted  . Class 2 obesity with body mass index (BMI) of 37.0 to 37.9 in adult 03/03/2018  . Osteoarthritis of both knees 12/19/2017  . Gastroesophageal reflux disease 12/19/2017  . Allergic rhinitis 09/15/2016  . Hypertension, essential, benign 09/15/2016  . Depression, major, single episode, moderate (HCC) 09/15/2016  . Vitamin D deficiency, unspecified 09/15/2016    Medication List:  Allergies as of 08/01/2020      Reactions   Codeine Nausea Only   Fish Allergy    Eel Only      Medication List       Accurate as of Aug 01, 2020 11:59 PM. If you have any questions, ask your nurse or doctor.        STOP taking these medications   amoxicillin 250 MG/5ML suspension Commonly known as: AMOXIL Stopped by: Alfonse Spruce,  MD   B-12 2000 MCG Tabs Stopped by: Alfonse Spruce, MD   HYDROcodone-acetaminophen 7.5-325 mg/15 ml solution Commonly known as: HYCET Stopped by: Alfonse Spruce, MD     TAKE these medications   ALLEGRA PO Take by mouth.   azelastine 0.1 % nasal spray Commonly known as: ASTELIN Place 1 spray into both nostrils daily. Use in each nostril as directed   citalopram 40 MG tablet Commonly known as: CELEXA Take 1 tablet by mouth once daily.   colestipol 1 g tablet Commonly known as: COLESTID Take 1 g by mouth 2 (two) times daily.   famotidine 20 MG tablet Commonly known as: PEPCID Take 20 mg by mouth at bedtime as needed.   ibuprofen 200 MG tablet Commonly known as: ADVIL Take 200 mg by mouth every 6 (six) hours as needed.   NASACORT ALLERGY 24HR NA Place into the nose.   Vitamin D3 50 MCG (2000 UT) Tabs Take 1 tablet by mouth daily.   Xhance 93 MCG/ACT Exhu Generic drug: Fluticasone Propionate 1-2 sprays per nostril 2 times daily Started by: Alfonse Spruce, MD       Birth  History: non-contributory  Developmental History: non-contributory  Past Surgical History: Past Surgical History:  Procedure Laterality Date  . TONSILLECTOMY Bilateral 01/01/2020   Procedure: TONSILLECTOMY;  Surgeon: Drema Halon, MD;  Location: [facility];  Service: ENT;  Laterality: Bilateral;  . TONSILLECTOMY    . TONSILLECTOMY Bilateral 12/2019  . WISDOM TOOTH EXTRACTION       Family History: Family History  Problem Relation Age of Onset  . Mental illness Maternal Grandmother        schizophrenia  . Cancer Paternal Grandmother        ovarian ,breast,lung  . Breast cancer Paternal Grandmother   . Allergic rhinitis Mother   . Allergic rhinitis Father   . Allergic rhinitis Sister      Social History: Ladene lives at home with her husband. 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 She is not  a smoker. There are no fume or chemical exposures in her home or work environment. There are no pets.    Review of Systems  Constitutional: Positive for malaise/fatigue. Negative for chills, fever and weight loss.  HENT: Positive for congestion and sinus pain. Negative for ear discharge, ear pain and sore throat.        Positive for postnasal drip.  Eyes: Negative for pain, discharge and redness.  Respiratory: Negative for cough, sputum production, shortness of breath and wheezing.   Cardiovascular: Negative.  Negative for chest pain and palpitations.  Gastrointestinal: Negative for abdominal pain, constipation, diarrhea, heartburn, nausea and vomiting.  Skin: Negative.  Negative for itching and rash.  Neurological: Negative for dizziness and headaches.  Endo/Heme/Allergies: Negative for environmental allergies. Does not bruise/bleed easily.       Objective:   Blood pressure 124/86, pulse 80, temperature 97.7 F (36.5 C), temperature source Temporal, resp. rate 18, height 5' 9.5" (1.765 m), weight 256 lb 3.2 oz (116.2 kg), SpO2 96 %. Body mass index is 37.29 kg/m.   Physical Exam:   Physical Exam Constitutional:      Appearance: She is well-developed. She is obese.  HENT:     Head: Normocephalic and atraumatic.     Right Ear: Tympanic membrane, ear canal and external ear normal. No drainage, swelling or tenderness. Tympanic membrane is not injected, scarred, erythematous, retracted or bulging.     Left Ear: Tympanic membrane, ear canal and external ear normal. No drainage, swelling or tenderness. Tympanic membrane is not injected, scarred, erythematous, retracted or bulging.     Nose: No nasal deformity, septal deviation, mucosal edema or rhinorrhea.     Right Turbinates: Enlarged, swollen and pale.     Left Turbinates: Enlarged, swollen and pale.     Right Sinus: No maxillary sinus tenderness or frontal sinus tenderness.     Left Sinus: No maxillary sinus tenderness or  frontal sinus tenderness.     Comments: No nasal polyps.    Mouth/Throat:     Mouth: Mucous membranes are not pale and not dry.     Pharynx: Uvula midline.  Eyes:     General:        Right eye: No discharge.        Left eye: No discharge.     Conjunctiva/sclera: Conjunctivae normal.     Right eye: Right conjunctiva is not injected. No chemosis.    Left eye: Left conjunctiva is not injected. No chemosis.    Pupils: Pupils are equal, round, and reactive to light.  Cardiovascular:  Rate and Rhythm: Normal rate and regular rhythm.     Heart sounds: Normal heart sounds.  Pulmonary:     Effort: Pulmonary effort is normal. No tachypnea, accessory muscle usage or respiratory distress.     Breath sounds: Normal breath sounds. No wheezing, rhonchi or rales.     Comments: Moving air well in all lung fields.  No increased work of breathing. Chest:     Chest wall: No tenderness.  Abdominal:     Tenderness: There is no abdominal tenderness. There is no guarding or rebound.  Lymphadenopathy:     Head:     Right side of head: No submandibular, tonsillar or occipital adenopathy.     Left side of head: No submandibular, tonsillar or occipital adenopathy.     Cervical: No cervical adenopathy.  Skin:    General: Skin is warm.     Capillary Refill: Capillary refill takes less than 2 seconds.     Coloration: Skin is not pale.     Findings: No abrasion, erythema, petechiae or rash. Rash is not papular, urticarial or vesicular.     Comments: No eczematous or urticarial lesions noted.  Neurological:     Mental Status: She is alert.      Diagnostic studies:   Allergy Studies:     Airborne Adult Perc - 08/01/20 1036    Time Antigen Placed 1036    Allergen Manufacturer Waynette Buttery    Location Back    Number of Test 59    1. Control-Buffer 50% Glycerol Negative    2. Control-Histamine 1 mg/ml 2+    3. Albumin saline Negative    4. Bahia Negative    5. French Southern Territories Negative    6. Johnson Negative     7. Kentucky Blue Negative    8. Meadow Fescue Negative    9. Perennial Rye Negative    10. Sweet Vernal Negative    11. Timothy Negative    12. Cocklebur Negative    13. Burweed Marshelder Negative    14. Ragweed, short Negative    15. Ragweed, Giant Negative    16. Plantain,  English 2+    17. Lamb's Quarters Negative    18. Sheep Sorrell Negative    19. Rough Pigweed Negative    20. Marsh Elder, Rough Negative    21. Mugwort, Common Negative    22. Ash mix Negative    23. Birch mix Negative    24. Beech American Negative    25. Box, Elder Negative    26. Cedar, red Negative    27. Cottonwood, Guinea[place]Bissau Negative    28. Elm mix Negative    29. Hickory Negative    30. Maple mix Negative    31. Oak, Guinea[place] mix Negative    32. Pecan Pollen Negative    33. Pine mix Negative    34. Sycamore Eastern Negative    35. Walnut, Black Pollen Negative    36. Alternaria alternata Negative    37. Cladosporium Herbarum Negative    38. Aspergillus mix Negative    39. Penicillium mix Negative    40. Bipolaris sorokiniana (Helminthosporium) Negative    41. Drechslera spicifera (Curvularia) Negative    42. Mucor plumbeus Negative    43. Fusarium moniliforme Negative    44. Aureobasidium pullulans (pullulara) Negative    45. Rhizopus oryzae 2+    46. Botrytis cinera Negative    47. Epicoccum nigrum Negative    48. Phoma betae Negative    49. Candida  Albicans Negative    50. Trichophyton mentagrophytes Negative    51. Mite, D Farinae  5,000 AU/ml Negative    52. Mite, D Pteronyssinus  5,000 AU/ml Negative    53. Cat Hair 10,000 BAU/ml Negative    54.  Dog Epithelia Negative    55. Mixed Feathers Negative    56. Horse Epithelia Negative    57. Cockroach, German 3+    58. Mouse Negative    59. Tobacco Leaf Negative          Intradermal - 08/01/20 1116    Time Antigen Placed 1116    Allergen Manufacturer Waynette Buttery    Location Arm    Number of Test 13    Intradermal Select     Control Negative    French Southern Territories Negative    Johnson Negative    7 Grass Negative    Ragweed mix 3+    Weed mix 3+    Tree mix 1+    Mold 1 Negative    Mold 2 Negative    Mold 3 Negative    Cat Negative    Dog 4+    Mite mix 4+          Food Adult Perc - 08/01/20 1000    Time Antigen Placed 1037    Allergen Manufacturer Greer    Location Back    Number of allergen test 72    1. Peanut Negative    2. Soybean Negative    3. Wheat Negative    4. Sesame Negative    5. Milk, cow Negative    6. Egg White, Chicken Negative    7. Casein Negative    8. Shellfish Mix Negative    9. Fish Mix Negative    10. Cashew Negative    11. Pecan Food Negative    12. Walnut Food Negative    13. Almond Negative    14. Hazelnut Negative    15. [place] nut Negative    16. Coconut Negative    17. Pistachio Negative    18. Catfish Negative    19. Bass Negative    20. Trout Negative    21. Tuna Negative    22. Salmon Negative    23. Flounder Negative    24. Codfish Negative    25. Shrimp Negative    26. Crab Negative    27. Lobster Negative    28. Oyster Negative    29. Scallops Negative    30. Barley Negative    31. Oat  Negative    32. Rye  Negative    33. Hops Negative    34. Rice Negative    35. Cottonseed Negative    36. Saccharomyces Cerevisiae  Negative    37. Pork Negative    38. [place] Meat Negative    39. Chicken Meat Negative    40. Beef Negative    41. Lamb Negative    42. Tomato Negative    43. White Potato Negative    44. Sweet Potato Negative    45. Pea, Green/English Negative    46. Navy Bean Negative    47. Mushrooms Negative    48. Avocado Negative    49. Onion Negative    50. Cabbage Negative    51. Carrots Negative    52. Celery Negative    53. Corn Negative    54. Cucumber Negative    55. Grape (White seedless) Negative    56. Orange  Negative  57. Banana Negative    58. Apple Negative    59. Peach Negative    60. Strawberry Negative    61.  Cantaloupe Negative    62. Watermelon Negative    63. Pineapple Negative    64. Chocolate/Cacao bean Negative    65. Karaya Gum Negative    66. Acacia (Arabic Gum) Negative    67. Cinnamon Negative    68. Nutmeg Negative    69. Ginger Negative    70. Garlic Negative    71. Pepper, black Negative    72. Mustard Negative           Allergy testing results were read and interpreted by myself, documented by clinical staff.         Malachi Bonds, MD [facility]

## 2020-08-01 NOTE — Patient Instructions (Addendum)
1. Seasonal and perennial allergic rhinitis - Testing today showed: grasses, ragweed, weeds, trees, indoor molds, dust mites, dog and cockroach - Copy of test results provided.  - Avoidance measures provided. - Stop taking: Nasacort - Continue with: Allegra (fexofenadine) 180mg  table once daily and Astelin (azelastine) 2 sprays per nostril 1-2 times daily as needed - Start taking: Xhance (fluticasone) 1-2 sprays per nostril twice daily - You can use an extra dose of the antihistamine, if needed, for breakthrough symptoms.  - Consider nasal saline rinses 1-2 times daily to remove allergens from the nasal cavities as well as help with mucous clearance (this is especially helpful to do before the nasal sprays are given) - Consider allergy shots as a means of long-term control. - Allergy shots "re-train" and "reset" the immune system to ignore environmental allergens and decrease the resulting immune response to those allergens (sneezing, itchy watery eyes, runny nose, nasal congestion, etc).    - Allergy shots improve symptoms in 75-85% of patients.  - We can discuss more at the next appointment if the medications are not working for you.  2. Food intolerance - Testing to the entire panel was negative.  - Copy of testing results provided. - There is a the low positive predictive value of food allergy testing and hence the high possibility of false positives. - In contrast, food allergy testing has a high negative predictive value, therefore if testing is negative we can be relatively assured that they are indeed negative.   3. Recurrent infections - We will obtain some screening labs to evaluate your immune system.  - Labs to evaluate the quantitative [facility]) aspects of your immune system: IgG/IgA/IgM, CBC with differential - Labs to evaluate the qualitative (HOW WELL THEY WORK) aspects of your immune system: CH50, Pneumococcal titers, Tetanus titers, Diphtheria titers - We may consider  immunizations with Pneumovax and Tdap to challenge your immune system, and then obtain repeat titers in 4-6 weeks.   4. Return in about 4 weeks (around 08/29/2020).    Please inform 10/29/2020 of any Emergency Department visits, hospitalizations, or changes in symptoms. Call us before going to the ED for breathing or allergy symptoms since we might be able to fit you in for a sick visit. Feel free to contact us anytime with any questions, problems, or concerns.  It was a pleasure to meet you today!  Websites that have reliable patient information: 1. American Academy of Asthma, Allergy, and Immunology: www.aaaai.org 2. Food Allergy Research and Education (FARE): foodallergy.org 3. Mothers of Asthmatics: http://www.asthmacommunitynetwork.org 4. American College of Allergy, Asthma, and Immunology: www.acaai.org   COVID-19 Vaccine Information can be found at: [place] For questions related to vaccine distribution or appointments, please email [email or web address] or call [phone].   We realize that you might be concerned about having an allergic reaction to the COVID19 vaccines. To help with that concern, WE ARE OFFERING THE COVID19 VACCINES IN OUR OFFICE! Ask the front desk for dates!     "Like" [phone] on Facebook and Instagram for our latest updates!      A healthy democracy works best when [place] participate! Make sure you are registered to vote! If you have moved or changed any of your contact information, you will need to get this updated before voting!  In some cases, you MAY be able to register to vote online: [employer]    Reducing Pollen Exposure  The American Academy of Allergy, Asthma and Immunology suggests the following steps to reduce your exposure  to pollen during allergy seasons.    1. Do not hang sheets or clothing out to dry; pollen may collect on these  items. 2. Do not mow lawns or spend time around freshly cut grass; mowing stirs up pollen. 3. Keep windows closed at night.  Keep car windows closed while driving. 4. Minimize morning activities outdoors, a time when pollen counts are usually at their highest. 5. Stay indoors as much as possible when pollen counts or humidity is high and on windy days when pollen tends to remain in the air longer. 6. Use air conditioning when possible.  Many air conditioners have filters that trap the pollen spores. 7. Use a HEPA room air filter to remove pollen form the indoor air you breathe.  Control of Dust Mite Allergen    Dust mites play a major role in allergic asthma and rhinitis.  They occur in environments with high humidity wherever human skin is found.  Dust mites absorb humidity from the atmosphere (ie, they do not drink) and feed on organic matter (including shed human and animal skin).  Dust mites are a microscopic type of insect that you cannot see with the naked eye.  High levels of dust mites have been detected from mattresses, pillows, carpets, upholstered furniture, bed covers, clothes, soft toys and any woven material.  The principal allergen of the dust mite is found in its feces.  A gram of dust may contain 1,000 mites and 250,000 fecal particles.  Mite antigen is easily measured in the air during house cleaning activities.  Dust mites do not bite and do not cause harm to humans, other than by triggering allergies/asthma.    Ways to decrease your exposure to dust mites in your home:  1. Encase mattresses, box springs and pillows with a mite-impermeable barrier or cover   2. Wash sheets, blankets and drapes weekly in hot water (130 F) with detergent and dry them in a dryer on the hot setting.  3. Have the room cleaned frequently with a vacuum cleaner and a damp dust-mop.  For carpeting or rugs, vacuuming with a vacuum cleaner equipped with a high-efficiency particulate air (HEPA) filter.  The  dust mite allergic individual should not be in a room which is being cleaned and should wait 1 hour after cleaning before going into the room. 4. Do not sleep on upholstered furniture (eg, couches).   5. If possible removing carpeting, upholstered furniture and drapery from the home is ideal.  Horizontal blinds should be eliminated in the rooms where the person spends the most time (bedroom, study, television room).  Washable vinyl, roller-type shades are optimal. 6. Remove all non-washable stuffed toys from the bedroom.  Wash stuffed toys weekly like sheets and blankets above.   7. Reduce indoor humidity to less than 50%.  Inexpensive humidity monitors can be purchased at most hardware stores.  Do not use a humidifier as can make the problem worse and are not recommended.  Control of Dog or Cat Allergen  Avoidance is the best way to manage a dog or cat allergy. If you have a dog or cat and are allergic to dog or cats, consider removing the dog or cat from the home. If you have a dog or cat but don't want to find it a new home, or if your family wants a pet even though someone in the household is allergic, here are some strategies that may help keep symptoms at bay:  1. Keep the pet out of  your bedroom and restrict it to only a few rooms. Be advised that keeping the dog or cat in only one room will not limit the allergens to that room. 2. Don't pet, hug or kiss the dog or cat; if you do, wash your hands with soap and water. 3. High-efficiency particulate air (HEPA) cleaners run continuously in a bedroom or living room can reduce allergen levels over time. 4. Regular use of a high-efficiency vacuum cleaner or a central vacuum can reduce allergen levels. 5. Giving your dog or cat a bath at least once a week can reduce airborne allergen.  Control of Cockroach Allergen  Cockroach allergen has been identified as an important cause of acute attacks of asthma, especially in urban settings.  There are  fifty-five species of cockroach that exist in the [place], however only three, the [place], [place] and Oriental species produce allergen that can affect patients with Asthma.  Allergens can be obtained from fecal particles, egg casings and secretions from cockroaches.    1. Remove food sources. 2. Reduce access to water. 3. Seal access and entry points. 4. Spray runways with 0.5-1% Diazinon or Chlorpyrifos 5. Blow boric acid power under stoves and refrigerator. 6. Place bait stations (hydramethylnon) at feeding sites.  Allergy Shots   Allergies are the result of a chain reaction that starts in the immune system. Your immune system controls how your body defends itself. For instance, if you have an allergy to pollen, your immune system identifies pollen as an invader or allergen. Your immune system overreacts by producing antibodies called Immunoglobulin E (IgE). These antibodies travel to cells that release chemicals, causing an allergic reaction.  The concept behind allergy immunotherapy, whether it is received in the form of shots or tablets, is that the immune system can be desensitized to specific allergens that trigger allergy symptoms. Although it requires time and patience, the payback can be long-term relief.  How Do Allergy Shots Work?  Allergy shots work much like a vaccine. Your body responds to injected amounts of a particular allergen given in increasing doses, eventually developing a resistance and tolerance to it. Allergy shots can lead to decreased, minimal or no allergy symptoms.  There generally are two phases: build-up and maintenance. Build-up often ranges from three to six months and involves receiving injections with increasing amounts of the allergens. The shots are typically given once or twice a week, though more rapid build-up schedules are sometimes used.  The maintenance phase begins when the most effective dose is reached. This dose is different for each  person, depending on how allergic you are and your response to the build-up injections. Once the maintenance dose is reached, there are longer periods between injections, typically two to four weeks.  Occasionally doctors give cortisone-type shots that can temporarily reduce allergy symptoms. These types of shots are different and should not be confused with allergy immunotherapy shots.  Who Can Be Treated with Allergy Shots?  Allergy shots may be a good treatment approach for people with allergic rhinitis (hay fever), allergic asthma, conjunctivitis (eye allergy) or stinging insect allergy.   Before deciding to begin allergy shots, you should consider:  . The length of allergy season and the severity of your symptoms . Whether medications and/or changes to your environment can control your symptoms . Your desire to avoid long-term medication use . Time: allergy immunotherapy requires a major time commitment . Cost: may vary depending on your insurance coverage  Allergy shots for children age 73five  and older are effective and often well tolerated. They might prevent the onset of new allergen sensitivities or the progression to asthma.  Allergy shots are not started on patients who are pregnant but can be continued on patients who become pregnant while receiving them. In some patients with other medical conditions or who take certain common medications, allergy shots may be of risk. It is important to mention other medications you talk to your allergist.   When Will I Feel Better?  Some may experience decreased allergy symptoms during the build-up phase. For others, it may take as long as 12 months on the maintenance dose. If there is no improvement after a year of maintenance, your allergist will discuss other treatment options with you.  If you aren't responding to allergy shots, it may be because there is not enough dose of the allergen in your vaccine or there are missing allergens that  were not identified during your allergy testing. Other reasons could be that there are high levels of the allergen in your environment or major exposure to non-allergic triggers like tobacco smoke.  What Is the Length of Treatment?  Once the maintenance dose is reached, allergy shots are generally continued for three to five years. The decision to stop should be discussed with your allergist at that time. Some people may experience a permanent reduction of allergy symptoms. Others may relapse and a longer course of allergy shots can be considered.  What Are the Possible Reactions?  The two types of adverse reactions that can occur with allergy shots are local and systemic. Common local reactions include very mild redness and swelling at the injection site, which can happen immediately or several hours after. A systemic reaction, which is less common, affects the entire body or a particular body system. They are usually mild and typically respond quickly to medications. Signs include increased allergy symptoms such as sneezing, a stuffy nose or hives.  Rarely, a serious systemic reaction called anaphylaxis can develop. Symptoms include swelling in the throat, wheezing, a feeling of tightness in the chest, nausea or dizziness. Most serious systemic reactions develop within 30 minutes of allergy shots. This is why it is strongly recommended you wait in your doctor's office for 30 minutes after your injections. Your allergist is trained to watch for reactions, and his or her staff is trained and equipped with the proper medications to identify and treat them.  Who Should Administer Allergy Shots?  The preferred location for receiving shots is your prescribing allergist's office. Injections can sometimes be given at another facility where the physician and staff are trained to recognize and treat reactions, and have received instructions by your prescribing allergist.

## 2020-08-04 ENCOUNTER — Encounter: Payer: Self-pay | Admitting: Allergy & Immunology

## 2020-08-20 LAB — CBC WITH DIFFERENTIAL
Basophils Absolute: 0.1 10*3/uL (ref 0.0–0.2)
Basos: 1 %
EOS (ABSOLUTE): 0.4 10*3/uL (ref 0.0–0.4)
Eos: 5 %
Hematocrit: 43.5 % (ref 34.0–46.6)
Hemoglobin: 14.8 g/dL (ref 11.1–15.9)
Immature Grans (Abs): 0.1 10*3/uL (ref 0.0–0.1)
Immature Granulocytes: 1 %
Lymphocytes Absolute: 1.9 10*3/uL (ref 0.7–3.1)
Lymphs: 23 %
MCH: 30.5 pg (ref 26.6–33.0)
MCHC: 34 g/dL (ref 31.5–35.7)
MCV: 90 fL (ref 79–97)
Monocytes Absolute: 0.5 10*3/uL (ref 0.1–0.9)
Monocytes: 6 %
Neutrophils Absolute: 5.2 10*3/uL (ref 1.4–7.0)
Neutrophils: 64 %
RBC: 4.85 x10E6/uL (ref 3.77–5.28)
RDW: 13.6 % (ref 11.7–15.4)
WBC: 8.1 10*3/uL (ref 3.4–10.8)

## 2020-08-20 LAB — STREP PNEUMONIAE 23 SEROTYPES IGG
Pneumo Ab Type 1*: 0.2 ug/mL — ABNORMAL LOW (ref >1.3)
Pneumo Ab Type 12 (12F)*: 0.2 ug/mL — ABNORMAL LOW (ref >1.3)
Pneumo Ab Type 14*: 0.6 ug/mL — ABNORMAL LOW (ref >1.3)
Pneumo Ab Type 17 (17F)*: 2.2 ug/mL (ref >1.3)
Pneumo Ab Type 19 (19F)*: 4.3 ug/mL (ref >1.3)
Pneumo Ab Type 2*: 1.2 ug/mL — ABNORMAL LOW (ref >1.3)
Pneumo Ab Type 20*: 3.2 ug/mL (ref >1.3)
Pneumo Ab Type 22 (22F)*: 0.5 ug/mL — ABNORMAL LOW (ref >1.3)
Pneumo Ab Type 23 (23F)*: 0.8 ug/mL — ABNORMAL LOW (ref >1.3)
Pneumo Ab Type 26 (6B)*: 1.8 ug/mL (ref >1.3)
Pneumo Ab Type 3*: 2.6 ug/mL (ref >1.3)
Pneumo Ab Type 34 (10A)*: 2.4 ug/mL (ref >1.3)
Pneumo Ab Type 4*: 0.2 ug/mL — ABNORMAL LOW (ref >1.3)
Pneumo Ab Type 43 (11A)*: 1.1 ug/mL — ABNORMAL LOW (ref >1.3)
Pneumo Ab Type 5*: 0.2 ug/mL — ABNORMAL LOW (ref >1.3)
Pneumo Ab Type 51 (7F)*: 0.3 ug/mL — ABNORMAL LOW (ref >1.3)
Pneumo Ab Type 54 (15B)*: 0.4 ug/mL — ABNORMAL LOW (ref >1.3)
Pneumo Ab Type 56 (18C)*: 4.9 ug/mL (ref >1.3)
Pneumo Ab Type 57 (19A)*: 2.1 ug/mL (ref >1.3)
Pneumo Ab Type 68 (9V)*: 0.4 ug/mL — ABNORMAL LOW (ref >1.3)
Pneumo Ab Type 70 (33F)*: 1.1 ug/mL — ABNORMAL LOW (ref >1.3)
Pneumo Ab Type 8*: 1 ug/mL — ABNORMAL LOW (ref >1.3)
Pneumo Ab Type 9 (9N)*: 0.5 ug/mL — ABNORMAL LOW (ref >1.3)

## 2020-08-20 LAB — DIPHTHERIA / TETANUS ANTIBODY PANEL
Diphtheria Ab: 0.12 IU/mL (ref <0.10)
Tetanus Ab, IgG: 1.54 IU/mL (ref <0.10)

## 2020-08-20 LAB — IGG, IGA, IGM
IgA/Immunoglobulin A, Serum: 203 mg/dL (ref 87–352)
IgG (Immunoglobin G), Serum: 1109 mg/dL (ref 586–1602)
IgM (Immunoglobulin M), Srm: 100 mg/dL (ref 26–217)

## 2020-08-20 LAB — COMPLEMENT, TOTAL: Compl, Total (CH50): >60 U/mL (ref >41)

## 2020-08-21 ENCOUNTER — Encounter: Payer: Self-pay | Admitting: Allergy & Immunology

## 2020-08-21 NOTE — Telephone Encounter (Signed)
She would like to hear something very soon and her pharmacy is [facility] [address]. [phone] or [phone] this number now.

## 2020-08-25 ENCOUNTER — Other Ambulatory Visit (INDEPENDENT_AMBULATORY_CARE_PROVIDER_SITE_OTHER): Payer: Self-pay | Admitting: Otolaryngology

## 2020-08-26 ENCOUNTER — Other Ambulatory Visit (HOSPITAL_COMMUNITY): Payer: Self-pay | Admitting: Family Medicine

## 2020-08-29 ENCOUNTER — Ambulatory Visit (INDEPENDENT_AMBULATORY_CARE_PROVIDER_SITE_OTHER): Payer: 59 | Admitting: Allergy & Immunology

## 2020-08-29 ENCOUNTER — Other Ambulatory Visit: Payer: Self-pay

## 2020-08-29 ENCOUNTER — Encounter: Payer: Self-pay | Admitting: Allergy & Immunology

## 2020-08-29 VITALS — BP 130/88 | HR 76 | Temp 98.2°F | Resp 18 | Ht 69.0 in | Wt 254.0 lb

## 2020-08-29 DIAGNOSIS — B999 Unspecified infectious disease: Secondary | ICD-10-CM

## 2020-08-29 DIAGNOSIS — R0683 Snoring: Secondary | ICD-10-CM | POA: Diagnosis not present

## 2020-08-29 DIAGNOSIS — R5383 Other fatigue: Secondary | ICD-10-CM | POA: Diagnosis not present

## 2020-08-29 DIAGNOSIS — J3089 Other allergic rhinitis: Secondary | ICD-10-CM | POA: Diagnosis not present

## 2020-08-29 DIAGNOSIS — J302 Other seasonal allergic rhinitis: Secondary | ICD-10-CM

## 2020-08-29 MED ORDER — TRIAMCINOLONE ACETONIDE 55 MCG/ACT NA AERO: 2.0000 | INHALATION_SPRAY | Freq: Every day | NASAL | 3 refills | Status: DC

## 2020-08-29 NOTE — Patient Instructions (Addendum)
1. Seasonal and perennial allergic rhinitis (grasses, ragweed, weeds, trees, indoor molds, dust mites, dog and cockroach) - Stop taking: Xhance  - Continue with: Allegra (fexofenadine) 180mg  table once daily and Astelin (azelastine) 2 sprays per nostril 1-2 times daily as needed - Restart taking: Nasacort (triamcinolone) two sprays per nostril daily - You can use an extra dose of the antihistamine, if needed, for breakthrough symptoms.  - Information on allergy shots provided.   2. Recurrent infections - We will get repeat Pneumococcal vaccine titers in one month. - Testing requisition provided.    3. Return in about 3 months (around 11/29/2020).    Please inform 01/29/2021 of any Emergency Department visits, hospitalizations, or changes in symptoms. Call us before going to the ED for breathing or allergy symptoms since we might be able to fit you in for a sick visit. Feel free to contact us anytime with any questions, problems, or concerns.  It was a pleasure to see you again today!  Websites that have reliable patient information: 1. American Academy of Asthma, Allergy, and Immunology: www.aaaai.org 2. Food Allergy Research and Education (FARE): foodallergy.org 3. Mothers of Asthmatics: http://www.asthmacommunitynetwork.org 4. American College of Allergy, Asthma, and Immunology: www.acaai.org   COVID-19 Vaccine Information can be found at: [place] For questions related to vaccine distribution or appointments, please email [email or web address] or call [phone].   We realize that you might be concerned about having an allergic reaction to the COVID19 vaccines. To help with that concern, WE ARE OFFERING THE COVID19 VACCINES IN OUR OFFICE! Ask the front desk for dates!     "Like" [phone] on Facebook and Instagram for our latest updates!      A healthy democracy works best when [place] participate! Make sure you are  registered to vote! If you have moved or changed any of your contact information, you will need to get this updated before voting!  In some cases, you MAY be able to register to vote online: [employer]

## 2020-08-29 NOTE — Progress Notes (Signed)
FOLLOW UP  Date of Service/Encounter:  08/29/20   Assessment:   Seasonal and perennial allergic rhinitis (grasses, ragweed, weeds, trees, indoor molds, dust mites, dog and cockroach) -interested in starting allergen immunotherapy   Food intolerance - now tolerating foods without a problem   Morning fatigue - ordered sleep study   Recurrent infections - ordered repeat pneumococcal titers to be collected in 1 month    Plan/Recommendations:    1. Seasonal and perennial allergic rhinitis (grasses, ragweed, weeds, trees, indoor molds, dust mites, dog and cockroach) - Stop taking: Xhance  - Continue with: Allegra (fexofenadine) 180mg  table once daily and Astelin (azelastine) 2 sprays per nostril 1-2 times daily as needed - Restart taking: Nasacort (triamcinolone) two sprays per nostril daily - You can use an extra dose of the antihistamine, if needed, for breakthrough symptoms.  - Information on allergy shots provided.   2. Recurrent infections - We will get repeat Pneumococcal vaccine titers in one month. - Testing requisition provided.    3. Return in about 3 months (around 11/29/2020).    Subjective:   Donna Carey is a 45 y.o. female presenting today for follow up of  Chief Complaint  Patient presents with   Allergic Rhinitis     Donna Carey has a history of the following: Patient Active Problem List   Diagnosis Date Noted   Class 2 obesity with body mass index (BMI) of 37.0 to 37.9 in adult 03/03/2018   Osteoarthritis of both knees 12/19/2017   Gastroesophageal reflux disease 12/19/2017   Allergic rhinitis 09/15/2016   Hypertension, essential, benign 09/15/2016   Depression, major, single episode, moderate (HCC) 09/15/2016   Vitamin D deficiency, unspecified 09/15/2016    History obtained from: chart review and patient.  Donna Carey is a 45 y.o. female presenting for a follow up visit.  She was last seen as a new patient in May 2022.  At  that time, she underwent testing that was positive to grasses, ragweed, weeds, trees, indoor mold, dust mite, dog, and cockroach.  We stopped her Nasacort and continued Allegra and Astelin.  We started her on Xhance 1 to 2 sprays per nostril twice daily.  We did testing to the most common foods and these were all negative.  We obtained an immune work-up due to a history of recurrent infections.  In the interim, she did develop vertigo which she attributed to her June 2022.  Her immune work-up was largely normal.  However, she was only protective to 8 out of 23 strains of Streptococcus pneumonia.  We recommended that she get a Pneumovax.  Since last visit, she has stopped the [place] due to the vertigo. She has been better vertigo wise. She has tried [place]. She remains on the Astelin as well. She has added dehumidifers and HEPA filters. These have made a difference overall. She is not going to stay at the vet clinic due to the work exposures. She also has two dogs in the home.  She has not needed antibiotics since last time we saw her.  She continues to be tired when she wakes up in the morning.  She thinks she might snore a little bit.  She has never had a sleep study but we did discuss at the last visit.  She does endorse some headaches as well, which is consistent with obstructive sleep apnea.  They finally have their house in [place] ready to sell.  They are going to be renting a house in [place] for  the next year or so while they build a house in [place].  Thankfully, her husband works for a [employer] and will get Proofreader at cost.  Otherwise, there have been no changes to her past medical history, surgical history, family history, or social history.    Review of Systems  Constitutional: Negative.  Negative for chills, fever, malaise/fatigue and weight loss.  HENT:  Positive for congestion and sinus pain. Negative for ear discharge and ear pain.        Positive for  postnasal drip.  Positive for rhinorrhea.  Eyes:  Negative for pain, discharge and redness.  Respiratory:  Negative for cough, sputum production, shortness of breath and wheezing.   Cardiovascular: Negative.  Negative for chest pain and palpitations.  Gastrointestinal:  Negative for abdominal pain, constipation, diarrhea, heartburn, nausea and vomiting.  Skin: Negative.  Negative for itching and rash.  Neurological:  Negative for dizziness and headaches.  Endo/Heme/Allergies:  Positive for environmental allergies. Does not bruise/bleed easily.      Objective:   Blood pressure 130/88, pulse 76, temperature 98.2 F (36.8 C), temperature source Temporal, resp. rate 18, height 5\' 9"  (1.753 m), weight 254 lb (115.2 kg), SpO2 97 %. Body mass index is 37.51 kg/m.   Physical Exam:  Physical Exam Constitutional:      Appearance: She is well-developed.     Comments: Very pleasant female.  HENT:     Head: Normocephalic and atraumatic.     Right Ear: Tympanic membrane, ear canal and external ear normal.     Left Ear: Tympanic membrane, ear canal and external ear normal.     Nose: Mucosal edema and rhinorrhea present. No nasal deformity or septal deviation.     Right Turbinates: Enlarged, swollen and pale.     Left Turbinates: Enlarged, swollen and pale.     Right Sinus: No maxillary sinus tenderness or frontal sinus tenderness.     Left Sinus: No maxillary sinus tenderness or frontal sinus tenderness.     Mouth/Throat:     Lips: Pink.     Mouth: Mucous membranes are moist. Mucous membranes are not pale and not dry.     Pharynx: Uvula midline.     Comments: Moderate cobblestoning. Eyes:     General: Lids are normal. No allergic shiner.       Right eye: No discharge.        Left eye: No discharge.     Conjunctiva/sclera: Conjunctivae normal.     Right eye: Right conjunctiva is not injected. No chemosis.    Left eye: Left conjunctiva is not injected. No chemosis.    Pupils: Pupils are  equal, round, and reactive to light.  Cardiovascular:     Rate and Rhythm: Normal rate and regular rhythm.     Heart sounds: Normal heart sounds.  Pulmonary:     Effort: Pulmonary effort is normal. No tachypnea, accessory muscle usage or respiratory distress.     Breath sounds: Normal breath sounds. No wheezing, rhonchi or rales.     Comments: Moving air well in all lung fields.  No increased work of breathing. Chest:     Chest wall: No tenderness.  Lymphadenopathy:     Cervical: No cervical adenopathy.  Skin:    General: Skin is warm.     Capillary Refill: Capillary refill takes less than 2 seconds.     Coloration: Skin is not pale.     Findings: No abrasion, erythema, petechiae or rash. Rash is not papular,  urticarial or vesicular.  Neurological:     Mental Status: She is alert.  Psychiatric:        Behavior: Behavior is cooperative.     Diagnostic studies: none        Malachi Bonds, MD  [facility]

## 2020-09-04 NOTE — Progress Notes (Signed)
Patient is scheduled with Huston Foley on 6/28 @3pm .

## 2020-09-05 ENCOUNTER — Other Ambulatory Visit: Payer: Self-pay

## 2020-09-05 ENCOUNTER — Ambulatory Visit (HOSPITAL_COMMUNITY)
Admission: RE | Admit: 2020-09-05 | Discharge: 2020-09-05 | Disposition: A | Discharge status: Home / Self Care | Payer: Self-pay | Source: Ambulatory Visit | Attending: Family Medicine | Admitting: Family Medicine

## 2020-09-05 DIAGNOSIS — E7800 Pure hypercholesterolemia, unspecified: Secondary | ICD-10-CM | POA: Insufficient documentation

## 2020-09-10 DIAGNOSIS — J3089 Other allergic rhinitis: Secondary | ICD-10-CM | POA: Diagnosis not present

## 2020-09-10 NOTE — Addendum Note (Signed)
Addended by: Alfonse Spruce on: 09/10/2020 04:45 PM   Modules accepted: Orders

## 2020-09-11 NOTE — Progress Notes (Signed)
Aeroallergen Immunotherapy   Ordering Provider: Dr. Malachi Bonds   Patient Details  Name: Donna Carey  MRN: [id number]  Date of Birth: Feb 12, 1976   Order 1 of 1   Vial Label: RW/W/T/D/DM   0.3 ml (Volume)  1:20 Concentration -- Ragweed Mix  0.2 ml (Volume)  1:10 Concentration -- Plantain English  0.5 ml (Volume)  1:20 Concentration -- Eastern 10 Tree Mix (also Sweet Gum)  0.7 ml (Volume)  1:10 Concentration -- Dog Epithelia  0.7 ml (Volume)   AU Concentration -- Mite Mix (DF 5,000 & DP 5,000)    2.4  ml Extract Subtotal  2.6  ml Diluent  5.0  ml Maintenance Total   Schedule:  A   Blue Vial (1:100,000): Schedule A (10 doses)  Yellow Vial (1:10,000): Schedule A (10 doses)  Green Vial (1:1,000): Schedule A (10 doses)  Red Vial (1:100): Schedule A (10 doses)   Special Instructions: none

## 2020-09-11 NOTE — Progress Notes (Signed)
VIALS MADE. EXP 09-11-21 

## 2020-09-16 ENCOUNTER — Other Ambulatory Visit: Payer: Self-pay

## 2020-09-16 ENCOUNTER — Encounter: Payer: Self-pay | Admitting: Neurology

## 2020-09-16 ENCOUNTER — Ambulatory Visit (INDEPENDENT_AMBULATORY_CARE_PROVIDER_SITE_OTHER): Payer: 59 | Admitting: Neurology

## 2020-09-16 VITALS — BP 126/82 | HR 69 | Ht 70.0 in | Wt 255.0 lb

## 2020-09-16 DIAGNOSIS — R519 Headache, unspecified: Secondary | ICD-10-CM | POA: Diagnosis not present

## 2020-09-16 DIAGNOSIS — E669 Obesity, unspecified: Secondary | ICD-10-CM | POA: Diagnosis not present

## 2020-09-16 DIAGNOSIS — Z9189 Other specified personal risk factors, not elsewhere classified: Secondary | ICD-10-CM

## 2020-09-16 DIAGNOSIS — R0683 Snoring: Secondary | ICD-10-CM | POA: Diagnosis not present

## 2020-09-16 DIAGNOSIS — G478 Other sleep disorders: Secondary | ICD-10-CM

## 2020-09-16 DIAGNOSIS — G4719 Other hypersomnia: Secondary | ICD-10-CM | POA: Diagnosis not present

## 2020-09-16 DIAGNOSIS — G4763 Sleep related bruxism: Secondary | ICD-10-CM

## 2020-09-16 NOTE — Progress Notes (Signed)
Subjective:    Patient ID: Donna Carey is a 45 y.o. female.  HPI    Huston Foley, MD, PhD [facility] [address], [address] [address] [place], [place] [address]  Dear Dr. Dellis Anes,  I saw your patient, Donna Carey, upon your kind request in my sleep clinic today for initial consultation of her sleep disorder, in particular, concern for underlying obstructive sleep apnea.  The patient is unaccompanied today.  As you know, [name] is a 45 year old right-handed woman with an underlying medical history of allergic rhinitis, hyperlipidemia, hypertension, anxiety, depression, and obesity, who reports snoring and excessive daytime somnolence as well as morning headaches.  I reviewed your office note from 08/29/2020.  Her Epworth sleepiness score is 3 out of 24, fatigue severity score is 48 out of 63.  She has trouble going to sleep, this is a chronic problem.  She has tried over-the-counter Unisom but has not tried melatonin.  She has been on Celexa for anxiety which has helped.  She has been on Celexa for years.  She has woken up with a headache.  She tries to get enough sleep, typically in bed between 9 and 10 PM and rise time is between 5:15 AM and 7 AM.  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  She has undergone tonsillectomy last year for prolonged infection and tonsillar stones.  She was also diagnosed with deviated septum.  She has a history of bruxism and uses currently an over-the-counter occlusal guard but has had a dentist made bite guard before.  She drinks caffeine in the form of coffee, 1 or 2 cups/day, no daily soda or tea.  She has a TV in the bedroom but does not typically use it.  She is a non-smoker and drinks alcohol occasionally.  She denies any telltale symptoms of restless leg syndrome but her husband has reported that she twitches in her sleep when she has a history of sleep talking.  She  does not have night to night nocturia.  She has not woken up with a sense of gasping for air.  She is not aware of any family history of sleep apnea.  She lives with her husband, they have no children.  She has 2 dogs and 3 cats in the household.  Her Past Medical History Is Significant For: Past Medical History:  Diagnosis Date   Allergy    Anxiety    Depression    Hyperlipidemia    Hypertension    Keratosis pilaris     Her Past Surgical History Is Significant For: Past Surgical History:  Procedure Laterality Date   TONSILLECTOMY Bilateral 01/01/2020   Procedure: TONSILLECTOMY;  Surgeon: Drema Halon, MD;  Location: [facility];  Service: ENT;  Laterality: Bilateral;   TONSILLECTOMY     TONSILLECTOMY Bilateral 12/2019   WISDOM TOOTH EXTRACTION      Her Family History Is Significant For: Family History  Problem Relation Age of Onset   Mental illness Maternal Grandmother        schizophrenia   Cancer Paternal Grandmother        ovarian ,breast,lung   Breast cancer Paternal Grandmother    Allergic rhinitis Mother    Allergic rhinitis Father    Allergic rhinitis Sister     Her Social History Is Significant For: Social History   Socioeconomic History   Marital status: Married    Spouse name: Not on  file   Number of children: Not on file   Years of education: Not on file   Highest education level: Not on file  Occupational History   Not on file  Tobacco Use   Smoking status: Never   Smokeless tobacco: Never  Vaping Use   Vaping Use: Never used  Substance and Sexual Activity   Alcohol use: Yes    Comment: rarely   Drug use: No   Sexual activity: Yes  Other Topics Concern   Not on file  Social History Narrative   Not on file   Social Determinants of Health   Financial Resource Strain: Not on file  Food Insecurity: Not on file  Transportation Needs: Not on file  Physical Activity: Not on file  Stress: Not on file  Social Connections:  Not on file    Her Allergies Are:  Allergies  Allergen Reactions   Codeine Nausea Only   Fish Allergy     Eel Only  :   Her Current Medications Are:  Outpatient Encounter Medications as of 09/16/2020  Medication Sig   azelastine (ASTELIN) 0.1 % nasal spray Place 1 spray into both nostrils daily. Use in each nostril as directed   Azelastine HCl 0.15 % SOLN USE 1 SPRAY IN EACH NOSTRIL TWO TIMES A DAY   Cholecalciferol (VITAMIN D3) 2000 units TABS Take 1 tablet by mouth daily.   citalopram (CELEXA) 40 MG tablet Take 1 tablet by mouth once daily.   colestipol (COLESTID) 1 g tablet Take 1 g by mouth 2 (two) times daily.   famotidine (PEPCID) 20 MG tablet Take 20 mg by mouth at bedtime as needed.   Fexofenadine HCl (ALLEGRA PO) Take by mouth.   ibuprofen (ADVIL) 200 MG tablet Take 200 mg by mouth every 6 (six) hours as needed.   rosuvastatin (CRESTOR) 5 MG tablet Take 5 mg by mouth daily.   triamcinolone (NASACORT) 55 MCG/ACT AERO nasal inhaler Place 2 sprays into the nose daily.   Triamcinolone Acetonide (NASACORT ALLERGY 24HR NA) Place into the nose.   vitamin B-12 (CYANOCOBALAMIN) 1000 MCG tablet 1 tablet   [DISCONTINUED] XHANCE 93 MCG/ACT EXHU 1-2 sprays per nostril 2 times daily (Patient not taking: Reported on 08/29/2020)   No facility-administered encounter medications on file as of 09/16/2020.  :   Review of Systems:  Out of a complete 14 point review of systems, all are reviewed and negative with the exception of these symptoms as listed below:  Review of Systems  Neurological:        Here for sleep consult. No prior sleep study. Pt reports daytime sleepiness is present and snoring is present.   Epworth Sleepiness Scale 0= would never doze 1= slight chance of dozing 2= moderate chance of dozing 3= high chance of dozing  Sitting and reading:0 Watching TV:0 Sitting inactive in a public place (ex. Theater or meeting):0 As a passenger in a car for an hour without a  break:0 Lying down to rest in the afternoon:3 Sitting and talking to someone:0 Sitting quietly after lunch (no alcohol):0 In a car, while stopped in traffic:0 Total:3    Objective:  Neurological Exam  Physical Exam Physical Examination:   Vitals:   09/16/20 1427  BP: 126/82  Pulse: 69  SpO2: 97%    General Examination: The patient is a very pleasant 45 y.o. female in no acute distress. She appears well-developed and well-nourished and well groomed.   HEENT: Normocephalic, atraumatic, pupils are equal, round and reactive to  light, extraocular tracking is good without limitation to gaze excursion or nystagmus noted. Hearing is grossly intact. Face is symmetric with normal facial animation. Speech is clear with no dysarthria noted. There is no hypophonia. There is no lip, neck/head, jaw or voice tremor. Neck is supple with full range of passive and active motion. There are no carotid bruits on auscultation. Oropharynx exam reveals: No significant mouth dryness, good dental hygiene, fairly benign airway with Mallampati class I, tonsils absent.  Tongue protrudes centrally and palate elevates symmetrically.  Neck circumference of 17-1/4 inches.  She has a minimal overbite.  Nasal inspection reveals a deviated septum to the right and fairly narrow left nasal passage compared to the right.    Chest: Clear to auscultation without wheezing, rhonchi or crackles noted.  Heart: S1+S2+0, regular and normal without murmurs, rubs or gallops noted.   Abdomen: Soft, non-tender and non-distended with normal bowel sounds appreciated on auscultation.  Extremities: There is no pitting edema in the distal lower extremities bilaterally.   Skin: Warm and dry without trophic changes noted.   Musculoskeletal: exam reveals no obvious joint deformities, tenderness or joint swelling or erythema.   Neurologically:  Mental status: The patient is awake, alert and oriented in all 4 spheres. Her immediate and  remote memory, attention, language skills and fund of knowledge are appropriate. There is no evidence of aphasia, agnosia, apraxia or anomia. Speech is clear with normal prosody and enunciation. Thought process is linear. Mood is normal and affect is normal.  Cranial nerves II - XII are as described above under HEENT exam.  Motor exam: Normal bulk, strength and tone is noted. There is no tremor, fine motor skills and coordination: grossly intact.  Cerebellar testing: No dysmetria or intention tremor. There is no truncal or gait ataxia.  Sensory exam: intact to light touch in the upper and lower extremities.  Gait, station and balance: She stands easily. No veering to one side is noted. No leaning to one side is noted. Posture is age-appropriate and stance is narrow based. Gait shows normal stride length and normal pace. No problems turning are noted.   Assessment and Plan:  In summary, Persephonie Chriselda Leppert is a very pleasant 45 y.o.-year old female with an underlying medical history of allergic rhinitis, hyperlipidemia, hypertension, anxiety, depression, and obesity, whose history and physical exam are concerning for obstructive sleep apnea (OSA). I had a long chat with the patient about my findings and the diagnosis of OSA, its prognosis and treatment options. We talked about medical treatments, surgical interventions and non-pharmacological approaches. I explained in particular the risks and ramifications of untreated moderate to severe OSA, especially with respect to developing cardiovascular disease down the Road, including congestive heart failure, difficult to treat hypertension, cardiac arrhythmias, or stroke. Even type 2 diabetes has, in part, been linked to untreated OSA. Symptoms of untreated OSA include daytime sleepiness, memory problems, mood irritability and mood disorder such as depression and anxiety, lack of energy, as well as recurrent headaches, especially morning headaches. We talked  about trying to maintain a healthy lifestyle in general, as well as the importance of weight control. We also talked about the importance of good sleep hygiene. I recommended the following at this time: sleep study.   I explained the sleep test procedure to the patient and also outlined possible surgical and non-surgical treatment options of OSA, including the use of a custom-made dental device (which would require a referral to a specialist dentist or oral surgeon), upper  airway surgical options, such as traditional UPPP or a novel less invasive surgical option in the form of Inspire hypoglossal nerve stimulation (which would involve a referral to an ENT surgeon). I also explained the CPAP treatment option to the patient, who indicated that she would be willing to try CPAP (or AutoPap) if the need arises. I explained the importance of being compliant with PAP treatment, not only for insurance purposes but primarily to improve Her symptoms, and for the patient's long term health benefit, including to reduce Her cardiovascular risks. I answered all her questions today and the patient was in agreement. I plan to see her back after the sleep study is completed and encouraged her to call with any interim questions, concerns, problems or updates.   Thank you very much for allowing me to participate in the care of this nice patient. If I can be of any further assistance to you please do not hesitate to call me at [phone].  Sincerely,   Huston Foley, MD, PhD

## 2020-09-16 NOTE — Patient Instructions (Signed)

## 2020-09-30 ENCOUNTER — Ambulatory Visit: Payer: Self-pay

## 2020-09-30 ENCOUNTER — Other Ambulatory Visit: Payer: Self-pay

## 2020-09-30 ENCOUNTER — Ambulatory Visit: Payer: 59

## 2020-09-30 MED ORDER — EPINEPHRINE 0.3 MG/0.3ML IJ SOAJ: 0.3000 mg | Freq: Once | INTRAMUSCULAR | 2 refills | Status: AC

## 2020-10-02 ENCOUNTER — Other Ambulatory Visit: Payer: Self-pay

## 2020-10-02 ENCOUNTER — Ambulatory Visit (INDEPENDENT_AMBULATORY_CARE_PROVIDER_SITE_OTHER): Payer: 59

## 2020-10-02 DIAGNOSIS — J309 Allergic rhinitis, unspecified: Secondary | ICD-10-CM

## 2020-10-02 LAB — STREP PNEUMONIAE 23 SEROTYPES IGG
Pneumo Ab Type 1*: 11.8 ug/mL (ref >1.3)
Pneumo Ab Type 12 (12F)*: >19.5 ug/mL (ref >1.3)
Pneumo Ab Type 14*: 11.4 ug/mL (ref >1.3)
Pneumo Ab Type 17 (17F)*: 1.9 ug/mL (ref >1.3)
Pneumo Ab Type 19 (19F)*: >30.3 ug/mL (ref >1.3)
Pneumo Ab Type 2*: 1.8 ug/mL (ref >1.3)
Pneumo Ab Type 20*: 4.2 ug/mL (ref >1.3)
Pneumo Ab Type 22 (22F)*: 5 ug/mL (ref >1.3)
Pneumo Ab Type 23 (23F)*: >14.4 ug/mL (ref >1.3)
Pneumo Ab Type 26 (6B)*: >41.4 ug/mL (ref >1.3)
Pneumo Ab Type 3*: 12.9 ug/mL (ref >1.3)
Pneumo Ab Type 34 (10A)*: >19.5 ug/mL (ref >1.3)
Pneumo Ab Type 4*: 3 ug/mL (ref >1.3)
Pneumo Ab Type 43 (11A)*: >7.6 ug/mL (ref >1.3)
Pneumo Ab Type 5*: 13.3 ug/mL (ref >1.3)
Pneumo Ab Type 51 (7F)*: >13.6 ug/mL (ref >1.3)
Pneumo Ab Type 54 (15B)*: 12.9 ug/mL (ref >1.3)
Pneumo Ab Type 56 (18C)*: >8.1 ug/mL (ref >1.3)
Pneumo Ab Type 57 (19A)*: >33.6 ug/mL (ref >1.3)
Pneumo Ab Type 68 (9V)*: 3.3 ug/mL (ref >1.3)
Pneumo Ab Type 70 (33F)*: >10.1 ug/mL (ref >1.3)
Pneumo Ab Type 8*: >25.3 ug/mL (ref >1.3)
Pneumo Ab Type 9 (9N)*: 4.9 ug/mL (ref >1.3)

## 2020-10-02 NOTE — Progress Notes (Signed)
Immunotherapy   Patient Details  Name: Donna Carey MRN: [id number] Date of Birth: April 05, 1975  10/02/2020  Trilby Drummer started injections for [id number]. Patient received 0.05 of her blue vial with an expiration of 09/11/2021. Patient waited for 30 minutes with no problems. Following schedule: A  Frequency:1 time per week Epi-Pen:Epi-Pen Available  Consent signed and patient instructions given.   Dub Mikes 10/02/2020, 9:26 AM

## 2020-10-03 ENCOUNTER — Encounter: Payer: Self-pay | Admitting: Neurology

## 2020-10-08 ENCOUNTER — Telehealth: Payer: Self-pay

## 2020-10-08 NOTE — Telephone Encounter (Signed)
LVM for pt to call me back to schedule sleep study  

## 2020-10-09 ENCOUNTER — Ambulatory Visit (INDEPENDENT_AMBULATORY_CARE_PROVIDER_SITE_OTHER): Payer: 59

## 2020-10-09 ENCOUNTER — Other Ambulatory Visit: Payer: Self-pay

## 2020-10-09 DIAGNOSIS — J309 Allergic rhinitis, unspecified: Secondary | ICD-10-CM

## 2020-10-21 ENCOUNTER — Other Ambulatory Visit: Payer: Self-pay

## 2020-10-21 ENCOUNTER — Ambulatory Visit (INDEPENDENT_AMBULATORY_CARE_PROVIDER_SITE_OTHER): Payer: 59

## 2020-10-21 DIAGNOSIS — J309 Allergic rhinitis, unspecified: Secondary | ICD-10-CM

## 2020-10-27 ENCOUNTER — Ambulatory Visit (INDEPENDENT_AMBULATORY_CARE_PROVIDER_SITE_OTHER): Payer: 59 | Admitting: Neurology

## 2020-10-27 DIAGNOSIS — G4733 Obstructive sleep apnea (adult) (pediatric): Secondary | ICD-10-CM

## 2020-10-27 DIAGNOSIS — G4719 Other hypersomnia: Secondary | ICD-10-CM

## 2020-10-27 DIAGNOSIS — G478 Other sleep disorders: Secondary | ICD-10-CM

## 2020-10-27 DIAGNOSIS — R519 Headache, unspecified: Secondary | ICD-10-CM

## 2020-10-27 DIAGNOSIS — G4763 Sleep related bruxism: Secondary | ICD-10-CM

## 2020-10-27 DIAGNOSIS — Z9189 Other specified personal risk factors, not elsewhere classified: Secondary | ICD-10-CM

## 2020-10-27 DIAGNOSIS — R0683 Snoring: Secondary | ICD-10-CM

## 2020-10-27 DIAGNOSIS — E669 Obesity, unspecified: Secondary | ICD-10-CM

## 2020-10-28 ENCOUNTER — Other Ambulatory Visit: Payer: Self-pay

## 2020-10-28 ENCOUNTER — Ambulatory Visit (INDEPENDENT_AMBULATORY_CARE_PROVIDER_SITE_OTHER): Payer: 59

## 2020-10-28 DIAGNOSIS — J309 Allergic rhinitis, unspecified: Secondary | ICD-10-CM | POA: Diagnosis not present

## 2020-10-29 NOTE — Progress Notes (Signed)
See procedure note.

## 2020-10-30 NOTE — Procedures (Signed)
     [facility]  HOME SLEEP TEST (Watch PAT) REPORT  STUDY DATE: 10/27/2020  DOB: February 17, 1976  MRN: [id number]  ORDERING CLINICIAN: Huston Foley, MD, PhD   REFERRING CLINICIAN: Dr. Dellis Anes  CLINICAL INFORMATION/HISTORY: 45 year old right-handed woman with an underlying medical history of allergic rhinitis, hyperlipidemia, hypertension, anxiety, depression, and obesity, who reports snoring and excessive daytime somnolence as well as morning headaches.    Epworth sleepiness score: 3/24.  BMI: 36.6 kg/m  FINDINGS:   Sleep Summary:   Total Recording Time (hours, min): 9 hours, 1 minute  Total Sleep Time (hours, min):  8 hours, 24 minutes   Percent REM (%):    29.9%   Respiratory Indices:   Calculated pAHI (per hour):  27.5/hour         REM pAHI:    48/hour       NREM pAHI: 18.8/hour  Oxygen Saturation Statistics:    Oxygen Saturation (%) Mean: 94%   Minimum oxygen saturation (%):                 82%   O2 Saturation Range (%): 82-98%    O2 Saturation (minutes) <=88%: 0.3 min  Pulse Rate Statistics:   Pulse Mean (bpm):    58/min    Pulse Range (41-90/min)   IMPRESSION: OSA (obstructive sleep apnea)   RECOMMENDATION:  This home sleep test demonstrates moderate obstructive sleep apnea with a total AHI of 27.5/hour and O2 nadir of 82%. Treatment with positive airway pressure is recommended. The patient will be advised to proceed with an autoPAP titration/trial at home for now. A full night titration study may be considered to optimize treatment settings, if needed down the road.  Alternative treatment options may include a dental device versus surgical options such as upper airway surgery or an implanted device, such as Inspire (hypoglossal nerve stimulator).  Concomitant weight loss is recommended.  Please note that untreated obstructive sleep apnea may carry additional perioperative morbidity. Patients with significant obstructive sleep apnea  should receive perioperative PAP therapy and the surgeons and particularly the anesthesiologist should be informed of the diagnosis and the severity of the sleep disordered breathing. The patient should be cautioned not to drive, work at heights, or operate dangerous or heavy equipment when tired or sleepy. Review and reiteration of good sleep hygiene measures should be pursued with any patient. Other causes of the patient's symptoms, including circadian rhythm disturbances, an underlying mood disorder, medication effect and/or an underlying medical problem cannot be ruled out based on this test. Clinical correlation is recommended. The patient and her referring provider will be notified of the test results. The patient will be seen in follow up in sleep clinic at [facility].  I certify that I have reviewed the raw data recording prior to the issuance of this report in accordance with the standards of the American Academy of Sleep Medicine (AASM).  INTERPRETING PHYSICIAN:   Huston Foley, MD, PhD  Board Certified in Neurology and Sleep Medicine  [facility] [address], [address] [place], [place] [address] [phone]

## 2020-10-30 NOTE — Addendum Note (Signed)
Addended by: Huston Foley on: 10/30/2020 04:18 PM   Modules accepted: Orders

## 2020-11-04 ENCOUNTER — Ambulatory Visit (INDEPENDENT_AMBULATORY_CARE_PROVIDER_SITE_OTHER): Payer: 59

## 2020-11-04 ENCOUNTER — Other Ambulatory Visit: Payer: Self-pay

## 2020-11-04 ENCOUNTER — Telehealth: Payer: Self-pay | Admitting: Neurology

## 2020-11-04 DIAGNOSIS — J309 Allergic rhinitis, unspecified: Secondary | ICD-10-CM | POA: Diagnosis not present

## 2020-11-04 NOTE — Telephone Encounter (Signed)
I called patient to discuss.  No answer, left a voicemail asking her to call me back. 

## 2020-11-04 NOTE — Telephone Encounter (Signed)
Pt is asking for a call re: who she is supposed to hear from re: picking up her CPAP & supplies, please call. Pt states sending her a mychart message is acceptable as well.

## 2020-11-04 NOTE — Telephone Encounter (Signed)
Please call and notify the patient that the recent home sleep test showed obstructive sleep apnea in the moderate range. I recommend treatment in the form of autoPAP, which means, that we don't have to bring her in for a sleep study with CPAP, but will let her start using a so called autoPAP machine at home, which is a CPAP-like machine with self-adjusting pressures. We will send the order to a local DME company (of her choice, or as per insurance requirement). The DME representative will fit her with a mask, educate her on how to use the machine, how to put the mask on, etc. I have placed an order in the chart. Please send the order, talk to patient, send report to referring MD. We will need a FU in sleep clinic for 10 weeks post-PAP set up, please arrange that with me or one of our NPs. Also reinforce the need for compliance with treatment. Thanks,   Huston Foley, MD, PhD  [facility])

## 2020-11-05 NOTE — Telephone Encounter (Signed)
I called the pt again to discuss. LVM asking for call back.

## 2020-11-06 NOTE — Telephone Encounter (Signed)
Went ahead and send the order for the patient to [facility] while waiting to hear back

## 2020-11-13 ENCOUNTER — Ambulatory Visit (INDEPENDENT_AMBULATORY_CARE_PROVIDER_SITE_OTHER): Payer: 59

## 2020-11-13 ENCOUNTER — Other Ambulatory Visit: Payer: Self-pay

## 2020-11-13 DIAGNOSIS — J309 Allergic rhinitis, unspecified: Secondary | ICD-10-CM

## 2020-11-20 ENCOUNTER — Other Ambulatory Visit: Payer: Self-pay

## 2020-11-20 ENCOUNTER — Ambulatory Visit (INDEPENDENT_AMBULATORY_CARE_PROVIDER_SITE_OTHER): Payer: 59

## 2020-11-20 DIAGNOSIS — J309 Allergic rhinitis, unspecified: Secondary | ICD-10-CM | POA: Diagnosis not present

## 2020-11-27 ENCOUNTER — Other Ambulatory Visit: Payer: Self-pay

## 2020-11-27 ENCOUNTER — Ambulatory Visit (INDEPENDENT_AMBULATORY_CARE_PROVIDER_SITE_OTHER): Payer: 59

## 2020-11-27 DIAGNOSIS — J309 Allergic rhinitis, unspecified: Secondary | ICD-10-CM | POA: Diagnosis not present

## 2020-12-04 ENCOUNTER — Other Ambulatory Visit: Payer: Self-pay

## 2020-12-04 ENCOUNTER — Ambulatory Visit (INDEPENDENT_AMBULATORY_CARE_PROVIDER_SITE_OTHER): Payer: 59

## 2020-12-04 DIAGNOSIS — J309 Allergic rhinitis, unspecified: Secondary | ICD-10-CM

## 2020-12-11 ENCOUNTER — Other Ambulatory Visit: Payer: Self-pay

## 2020-12-11 ENCOUNTER — Ambulatory Visit (INDEPENDENT_AMBULATORY_CARE_PROVIDER_SITE_OTHER): Payer: No Typology Code available for payment source

## 2020-12-11 DIAGNOSIS — J309 Allergic rhinitis, unspecified: Secondary | ICD-10-CM | POA: Diagnosis not present

## 2020-12-18 ENCOUNTER — Other Ambulatory Visit: Payer: Self-pay

## 2020-12-18 ENCOUNTER — Ambulatory Visit (INDEPENDENT_AMBULATORY_CARE_PROVIDER_SITE_OTHER): Payer: No Typology Code available for payment source

## 2020-12-18 DIAGNOSIS — J309 Allergic rhinitis, unspecified: Secondary | ICD-10-CM

## 2020-12-25 ENCOUNTER — Ambulatory Visit (INDEPENDENT_AMBULATORY_CARE_PROVIDER_SITE_OTHER): Payer: No Typology Code available for payment source

## 2020-12-25 ENCOUNTER — Other Ambulatory Visit: Payer: Self-pay

## 2020-12-25 DIAGNOSIS — J309 Allergic rhinitis, unspecified: Secondary | ICD-10-CM

## 2021-01-01 ENCOUNTER — Ambulatory Visit (INDEPENDENT_AMBULATORY_CARE_PROVIDER_SITE_OTHER): Payer: No Typology Code available for payment source

## 2021-01-01 ENCOUNTER — Other Ambulatory Visit: Payer: Self-pay

## 2021-01-01 DIAGNOSIS — J309 Allergic rhinitis, unspecified: Secondary | ICD-10-CM | POA: Diagnosis not present

## 2021-01-08 ENCOUNTER — Other Ambulatory Visit: Payer: Self-pay

## 2021-01-08 ENCOUNTER — Ambulatory Visit (INDEPENDENT_AMBULATORY_CARE_PROVIDER_SITE_OTHER): Payer: No Typology Code available for payment source

## 2021-01-08 DIAGNOSIS — J309 Allergic rhinitis, unspecified: Secondary | ICD-10-CM

## 2021-01-15 ENCOUNTER — Ambulatory Visit (INDEPENDENT_AMBULATORY_CARE_PROVIDER_SITE_OTHER): Payer: No Typology Code available for payment source

## 2021-01-15 ENCOUNTER — Other Ambulatory Visit: Payer: Self-pay

## 2021-01-15 DIAGNOSIS — J309 Allergic rhinitis, unspecified: Secondary | ICD-10-CM | POA: Diagnosis not present

## 2021-01-22 ENCOUNTER — Ambulatory Visit (INDEPENDENT_AMBULATORY_CARE_PROVIDER_SITE_OTHER): Payer: No Typology Code available for payment source

## 2021-01-22 ENCOUNTER — Other Ambulatory Visit: Payer: Self-pay

## 2021-01-22 DIAGNOSIS — J309 Allergic rhinitis, unspecified: Secondary | ICD-10-CM | POA: Diagnosis not present

## 2021-01-29 ENCOUNTER — Ambulatory Visit (INDEPENDENT_AMBULATORY_CARE_PROVIDER_SITE_OTHER): Payer: No Typology Code available for payment source

## 2021-01-29 ENCOUNTER — Other Ambulatory Visit: Payer: Self-pay

## 2021-01-29 DIAGNOSIS — J309 Allergic rhinitis, unspecified: Secondary | ICD-10-CM

## 2021-02-05 ENCOUNTER — Telehealth: Payer: Self-pay | Admitting: Neurology

## 2021-02-05 NOTE — Telephone Encounter (Signed)
Left message to get appt scheduled, pt needs to be schedule between 03/02/2021-04/30/2021 DME: [facility] DATE Setup- 01/30/2021 P: [phone] F: [phone] Equipment Issued: Crista Curb Inform pt to bring machine and power cord

## 2021-02-10 ENCOUNTER — Other Ambulatory Visit: Payer: Self-pay

## 2021-02-10 ENCOUNTER — Ambulatory Visit (INDEPENDENT_AMBULATORY_CARE_PROVIDER_SITE_OTHER): Payer: No Typology Code available for payment source

## 2021-02-10 DIAGNOSIS — J309 Allergic rhinitis, unspecified: Secondary | ICD-10-CM

## 2021-02-19 ENCOUNTER — Other Ambulatory Visit: Payer: Self-pay

## 2021-02-19 ENCOUNTER — Ambulatory Visit (INDEPENDENT_AMBULATORY_CARE_PROVIDER_SITE_OTHER): Payer: No Typology Code available for payment source

## 2021-02-19 DIAGNOSIS — J309 Allergic rhinitis, unspecified: Secondary | ICD-10-CM

## 2021-02-26 ENCOUNTER — Other Ambulatory Visit: Payer: Self-pay

## 2021-02-26 ENCOUNTER — Ambulatory Visit (INDEPENDENT_AMBULATORY_CARE_PROVIDER_SITE_OTHER): Payer: No Typology Code available for payment source

## 2021-02-26 DIAGNOSIS — J309 Allergic rhinitis, unspecified: Secondary | ICD-10-CM

## 2021-03-05 ENCOUNTER — Other Ambulatory Visit: Payer: Self-pay

## 2021-03-05 ENCOUNTER — Ambulatory Visit (INDEPENDENT_AMBULATORY_CARE_PROVIDER_SITE_OTHER): Payer: No Typology Code available for payment source

## 2021-03-05 DIAGNOSIS — J309 Allergic rhinitis, unspecified: Secondary | ICD-10-CM | POA: Diagnosis not present

## 2021-03-10 ENCOUNTER — Encounter: Payer: Self-pay | Admitting: Adult Health

## 2021-03-12 ENCOUNTER — Other Ambulatory Visit: Payer: Self-pay

## 2021-03-12 ENCOUNTER — Ambulatory Visit (INDEPENDENT_AMBULATORY_CARE_PROVIDER_SITE_OTHER): Payer: No Typology Code available for payment source

## 2021-03-12 DIAGNOSIS — J309 Allergic rhinitis, unspecified: Secondary | ICD-10-CM | POA: Diagnosis not present

## 2021-03-17 ENCOUNTER — Telehealth: Payer: Self-pay

## 2021-03-17 NOTE — Telephone Encounter (Signed)
Contacted pt, LVM rq she bring CPAP machine with her to her appt tomorrow, will also send MyChart message

## 2021-03-18 ENCOUNTER — Encounter: Payer: Self-pay | Admitting: Adult Health

## 2021-03-18 ENCOUNTER — Ambulatory Visit: Payer: No Typology Code available for payment source | Admitting: Adult Health

## 2021-03-18 VITALS — BP 129/83 | HR 70 | Ht 70.0 in | Wt 265.0 lb

## 2021-03-18 DIAGNOSIS — G4733 Obstructive sleep apnea (adult) (pediatric): Secondary | ICD-10-CM

## 2021-03-18 DIAGNOSIS — Z789 Other specified health status: Secondary | ICD-10-CM

## 2021-03-18 DIAGNOSIS — Z9989 Dependence on other enabling machines and devices: Secondary | ICD-10-CM

## 2021-03-18 NOTE — Progress Notes (Signed)
[facility] [address] [place]. [place] [address] [phone]       OFFICE FOLLOW UP NOTE  Donna Carey Date of Birth:  [birth year] Medical Record Number:  [id number]   Primary neurologist: Dr. Frances Carey Reason for visit: Initial CPAP follow-up    SUBJECTIVE:   CHIEF COMPLAINT:  Chief Complaint  Patient presents with   Obstructive Sleep Apnea    RM 3 alone Pt is well, having some trouble tolerating CPAP and doesn't sleep well with it.     HPI:   Update 03/18/2021 JM: Completed HST 10/27/2020 for snoring and excessive daytime somnolence as well as morning headaches which showed moderate OSA with total AHI of 27.5/h and O2 nadir of 82%.  AutoPap titration/trial recommended and started on 01/30/2021.  Returns today for initial CPAP follow-up unaccompanied.  She reports having difficulty tolerating current mask (nasal mask). Also tried full face mask but again difficulty tolerating. She has been trying to use in the evening while sitting in bed to help with tolerance but once ready for bed, will have difficulty falling asleep with mind racing. PCP recently changed antidepressant/anxiety med from Celexa to duloxetine with some improvement. She questions benefit of sleep aide (?discussed with Dr. Frances Carey at prior visit).  Epworth Sleepiness Scale 3/24 (prior to therapy 3).  Fatigue severity scale 28/63 (prior to therapy 48).   Compliance report from 02/08/2021 -03/10/2021 shows 15 out of 31 usage days with 4 days greater than 4 hours for 13% compliance.  Residual AHI 0.2.  Pressure in the 95th percentile 6.3 with set pressure 6/12.  Leaks in the 95th percentile 6.7.         Copied from Dr. Teofilo Carey initial OV 09/16/2020 [name] is a 45 year old right-handed woman with an underlying medical history of allergic rhinitis, hyperlipidemia, hypertension, anxiety, depression, and obesity, who reports snoring and excessive daytime somnolence as well as  morning headaches.  I reviewed your office note from 08/29/2020.  Her Epworth sleepiness score is 3 out of 24, fatigue severity score is 48 out of 63.  She has trouble going to sleep, this is a chronic problem.  She has tried over-the-counter Unisom but has not tried melatonin.  She has been on Celexa for anxiety which has helped.  She has been on Celexa for years.  She has woken up with a headache.  She tries to get enough sleep, typically in bed between 9 and 10 PM and rise time is between 5:15 AM and 7 AM.  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  She has undergone tonsillectomy last year for prolonged infection and tonsillar stones.  She was also diagnosed with deviated septum.  She has a history of bruxism and uses currently an over-the-counter occlusal guard but has had a dentist made bite guard before.  She drinks caffeine in the form of coffee, 1 or 2 cups/day, no daily soda or tea.  She has a TV in the bedroom but does not typically use it.  She is a non-smoker and drinks alcohol occasionally.  She denies any telltale symptoms of restless leg syndrome but her husband has reported that she twitches in her sleep when she has a history of sleep talking.  She does not have night to night nocturia.  She has not woken up with a sense of gasping for air.  She is not aware of any family history of sleep apnea.  She lives with her  husband, they have no children.  She has 2 dogs and 3 cats in the household.        ROS:   14 system review of systems performed and negative with exception of those listed in HPI  PMH:  Past Medical History:  Diagnosis Date   Allergy    Anxiety    Depression    Hyperlipidemia    Hypertension    Keratosis pilaris     PSH:  Past Surgical History:  Procedure Laterality Date   TONSILLECTOMY Bilateral 01/01/2020   Procedure: TONSILLECTOMY;  Surgeon: Donna Halon, MD;  Location: [facility];   Service: ENT;  Laterality: Bilateral;   TONSILLECTOMY     TONSILLECTOMY Bilateral 12/2019   WISDOM TOOTH EXTRACTION      Social History:  Social History   Socioeconomic History   Marital status: Married    Spouse name: Not on file   Number of children: Not on file   Years of education: Not on file   Highest education level: Not on file  Occupational History   Not on file  Tobacco Use   Smoking status: Never   Smokeless tobacco: Never  Vaping Use   Vaping Use: Never used  Substance and Sexual Activity   Alcohol use: Yes    Comment: rarely   Drug use: No   Sexual activity: Yes  Other Topics Concern   Not on file  Social History Narrative   Not on file   Social Determinants of Health   Financial Resource Strain: Not on file  Food Insecurity: Not on file  Transportation Needs: Not on file  Physical Activity: Not on file  Stress: Not on file  Social Connections: Not on file  Intimate Partner Violence: Not on file    Family History:  Family History  Problem Relation Age of Onset   Mental illness Maternal Grandmother        schizophrenia   Cancer Paternal Grandmother        ovarian ,breast,lung   Breast cancer Paternal Grandmother    Allergic rhinitis Mother    Allergic rhinitis Father    Allergic rhinitis Sister     Medications:   Current Outpatient Medications on File Prior to Visit  Medication Sig Dispense Refill   azelastine (ASTELIN) 0.1 % nasal spray Place 1 spray into both nostrils daily. Use in each nostril as directed     Azelastine HCl 0.15 % SOLN USE 1 SPRAY IN EACH NOSTRIL TWO TIMES A DAY (Patient taking differently: as needed.) 30 mL 5   Cholecalciferol (VITAMIN D3) 2000 units TABS Take 1 tablet by mouth daily.     citalopram (CELEXA) 40 MG tablet Take 1 tablet by mouth once daily. 90 tablet 3   colestipol (COLESTID) 1 g tablet Take 1 g by mouth 2 (two) times daily.     famotidine (PEPCID) 20 MG tablet Take 20 mg by mouth at bedtime as needed.      Fexofenadine HCl (ALLEGRA PO) Take by mouth.     ibuprofen (ADVIL) 200 MG tablet Take 200 mg by mouth every 6 (six) hours as needed.     rosuvastatin (CRESTOR) 5 MG tablet Take 5 mg by mouth daily.     triamcinolone (NASACORT) 55 MCG/ACT AERO nasal inhaler Place 2 sprays into the nose daily. 16.5 g 3   Triamcinolone Acetonide (NASACORT ALLERGY 24HR NA) Place into the nose.     vitamin B-12 (CYANOCOBALAMIN) 1000 MCG tablet 1 tablet  No current facility-administered medications on file prior to visit.    Allergies:   Allergies  Allergen Reactions   Codeine Nausea Only   Fish Allergy     Eel Only      OBJECTIVE:  Physical Exam  Vitals:   03/18/21 0737  BP: 129/83  Pulse: 70  Weight: 265 lb (120.2 kg)  Height: 5\' 10"  (1.778 m)   Body mass index is 38.02 kg/m. No results found.  General: obese very pleasant middle-age Caucasian female, seated, in no evident distress Head: head normocephalic and atraumatic.   Neck: supple with no carotid or supraclavicular bruits Cardiovascular: regular rate and rhythm, no murmurs Musculoskeletal: no deformity Skin:  no rash/petichiae Vascular:  Normal pulses all extremities   Neurologic Exam Mental Status: Awake and fully alert. Oriented to place and time. Recent and remote memory intact. Attention span, concentration and fund of knowledge appropriate. Mood and affect appropriate.  Cranial Nerves: Pupils equal, briskly reactive to light. Extraocular movements full without nystagmus. Visual fields full to confrontation. Hearing intact. Facial sensation intact. Face, tongue, palate moves normally and symmetrically.  Motor: Normal bulk and tone. Normal strength in all tested extremity muscles Sensory.: intact to touch , pinprick , position and vibratory sensation.  Coordination: Rapid alternating movements normal in all extremities. Finger-to-nose and heel-to-shin performed accurately bilaterally. Gait and Station: Arises from chair  without difficulty. Stance is normal. Gait demonstrates normal stride length and balance without use of AD. Tandem walk and heel toe without difficulty.  Reflexes: 1+ and symmetric. Toes downgoing.         ASSESSMENT/PLAN: Donna Carey is a 45 y.o. year old female with recent diagnosis of moderate OSA followed by Dr. 49.  CPAP initiated 01/30/2021 through [facility]  Difficulty tolerating nasal mask with poor compliance at 13%. Recommend trial of nasal pillow to help improve tolerance and usage. Order will be sent to DME company. Recommend using CPAP for 20-30 min while not sleeping to help improve tolerance but do not recommend doing this in bed - should only be sitting in bed to sleep. Would not recommend sleep aide at this time and hopefully insomnia will improve with new mask and routine use of CPAP.  Discussed importance of nightly CPAP usage.  She will follow-up in 6 months or call earlier if needed    CC:  PCP: [place], 13/01/2021, FNP    I spent 31 minutes of face-to-face and non-face-to-face time with patient.  This included previsit chart review, lab review, study review, order entry, electronic health record documentation, patient education regarding new diagnosis of OSA on CPAP with review and discussion of CPAP compliance report, pertinent education regarding CPAP use and tolerance concerns and answered all other questions to patient satisfaction   Verda Cumins, AGNP-BC  [facility] [address] [place], [place]  Phone [phone] Fax [phone] Note: This document was prepared with digital dictation and possible smart phrase technology. Any transcriptional errors that result from this process are unintentional.

## 2021-03-31 ENCOUNTER — Ambulatory Visit (INDEPENDENT_AMBULATORY_CARE_PROVIDER_SITE_OTHER): Payer: No Typology Code available for payment source

## 2021-03-31 ENCOUNTER — Other Ambulatory Visit: Payer: Self-pay

## 2021-03-31 DIAGNOSIS — J309 Allergic rhinitis, unspecified: Secondary | ICD-10-CM | POA: Diagnosis not present

## 2021-04-09 ENCOUNTER — Other Ambulatory Visit: Payer: Self-pay

## 2021-04-09 ENCOUNTER — Ambulatory Visit (INDEPENDENT_AMBULATORY_CARE_PROVIDER_SITE_OTHER): Payer: No Typology Code available for payment source

## 2021-04-09 DIAGNOSIS — J309 Allergic rhinitis, unspecified: Secondary | ICD-10-CM

## 2021-04-14 ENCOUNTER — Ambulatory Visit (INDEPENDENT_AMBULATORY_CARE_PROVIDER_SITE_OTHER): Payer: No Typology Code available for payment source

## 2021-04-14 ENCOUNTER — Other Ambulatory Visit: Payer: Self-pay

## 2021-04-14 DIAGNOSIS — J309 Allergic rhinitis, unspecified: Secondary | ICD-10-CM

## 2021-04-16 ENCOUNTER — Encounter: Payer: Self-pay | Admitting: Allergy & Immunology

## 2021-04-23 ENCOUNTER — Other Ambulatory Visit: Payer: Self-pay

## 2021-04-23 ENCOUNTER — Ambulatory Visit (INDEPENDENT_AMBULATORY_CARE_PROVIDER_SITE_OTHER): Payer: No Typology Code available for payment source

## 2021-04-23 DIAGNOSIS — J309 Allergic rhinitis, unspecified: Secondary | ICD-10-CM | POA: Diagnosis not present

## 2021-04-30 ENCOUNTER — Ambulatory Visit (INDEPENDENT_AMBULATORY_CARE_PROVIDER_SITE_OTHER): Payer: No Typology Code available for payment source

## 2021-04-30 ENCOUNTER — Other Ambulatory Visit: Payer: Self-pay

## 2021-04-30 DIAGNOSIS — J309 Allergic rhinitis, unspecified: Secondary | ICD-10-CM | POA: Diagnosis not present

## 2021-05-07 ENCOUNTER — Ambulatory Visit (INDEPENDENT_AMBULATORY_CARE_PROVIDER_SITE_OTHER): Payer: No Typology Code available for payment source | Admitting: *Deleted

## 2021-05-07 ENCOUNTER — Other Ambulatory Visit: Payer: Self-pay

## 2021-05-07 DIAGNOSIS — J309 Allergic rhinitis, unspecified: Secondary | ICD-10-CM | POA: Diagnosis not present

## 2021-05-14 ENCOUNTER — Other Ambulatory Visit (HOSPITAL_BASED_OUTPATIENT_CLINIC_OR_DEPARTMENT_OTHER): Payer: Self-pay | Admitting: Family Medicine

## 2021-05-14 DIAGNOSIS — R319 Hematuria, unspecified: Secondary | ICD-10-CM

## 2021-05-15 ENCOUNTER — Ambulatory Visit (HOSPITAL_BASED_OUTPATIENT_CLINIC_OR_DEPARTMENT_OTHER)
Admission: RE | Admit: 2021-05-15 | Discharge: 2021-05-15 | Disposition: A | Discharge status: Home / Self Care | Payer: No Typology Code available for payment source | Source: Ambulatory Visit | Attending: Family Medicine | Admitting: Family Medicine

## 2021-05-15 ENCOUNTER — Other Ambulatory Visit: Payer: Self-pay

## 2021-05-15 DIAGNOSIS — R319 Hematuria, unspecified: Secondary | ICD-10-CM | POA: Diagnosis present

## 2021-05-21 ENCOUNTER — Ambulatory Visit (INDEPENDENT_AMBULATORY_CARE_PROVIDER_SITE_OTHER): Payer: No Typology Code available for payment source

## 2021-05-21 ENCOUNTER — Other Ambulatory Visit: Payer: Self-pay

## 2021-05-21 DIAGNOSIS — J309 Allergic rhinitis, unspecified: Secondary | ICD-10-CM | POA: Diagnosis not present

## 2021-05-28 ENCOUNTER — Ambulatory Visit (INDEPENDENT_AMBULATORY_CARE_PROVIDER_SITE_OTHER): Payer: No Typology Code available for payment source

## 2021-05-28 DIAGNOSIS — J309 Allergic rhinitis, unspecified: Secondary | ICD-10-CM

## 2021-06-02 ENCOUNTER — Other Ambulatory Visit: Payer: Self-pay

## 2021-06-02 ENCOUNTER — Encounter: Payer: Self-pay | Admitting: Allergy & Immunology

## 2021-06-02 ENCOUNTER — Ambulatory Visit (INDEPENDENT_AMBULATORY_CARE_PROVIDER_SITE_OTHER): Payer: No Typology Code available for payment source | Admitting: Allergy & Immunology

## 2021-06-02 VITALS — BP 126/80 | HR 77 | Temp 97.9°F | Resp 18 | Ht 70.0 in | Wt 265.0 lb

## 2021-06-02 DIAGNOSIS — B999 Unspecified infectious disease: Secondary | ICD-10-CM

## 2021-06-02 DIAGNOSIS — J3089 Other allergic rhinitis: Secondary | ICD-10-CM

## 2021-06-02 DIAGNOSIS — J302 Other seasonal allergic rhinitis: Secondary | ICD-10-CM

## 2021-06-02 NOTE — Patient Instructions (Addendum)
1. Seasonal and perennial allergic rhinitis (grasses, ragweed, weeds, trees, indoor molds, dust mites, dog and cockroach) ?- Stop taking: both nose sprays  ?- Continue with: Allegra (fexofenadine) 180mg  table once daily (can use TWICE DAILY on bad days) ?- Start taking: Ryaltris one spray per nostril twice daily (can use once daily if this is enough to control symptoms) ?- You can use an extra dose of the antihistamine, if needed, for breakthrough symptoms.  ?- We will increase your dog in your next vial to see if this adds some more protection. ? ?2. Recurrent infections ?- You responded SO WELL to the Pneumovax!  ?- Nice work!  ? ?3. Return in about 6 months (around 12/03/2021).  ? ? ?Please inform 12/05/2021 of any Emergency Department visits, hospitalizations, or changes in symptoms. Call us before going to the ED for breathing or allergy symptoms since we might be able to fit you in for a sick visit. Feel free to contact us anytime with any questions, problems, or concerns. ? ?It was a pleasure to see you again today! ? ?Websites that have reliable patient information: ?1. American Academy of Asthma, Allergy, and Immunology: www.aaaai.org ?2. Food Allergy Research and Education (FARE): foodallergy.org ?3. Mothers of Asthmatics: http://www.asthmacommunitynetwork.org ?4. [place] of Allergy, Asthma, and Immunology: [employer] ? ? ?COVID-19 Vaccine Information can be found at: MissingWeapons.ca For questions related to vaccine distribution or appointments, please email [email or web address] or call [phone].  ? ?We realize that you might be concerned about having an allergic reaction to the COVID19 vaccines. To help with that concern, WE ARE OFFERING THE COVID19 VACCINES IN OUR OFFICE! Ask the front desk for dates!  ? ? ? ??Like? [phone] on Facebook and Instagram for our latest updates!  ?  ? ? ?A healthy democracy works best when [place]  participate! Make sure you are registered to vote! If you have moved or changed any of your contact information, you will need to get this updated before voting! ? ?In some cases, you MAY be able to register to vote online: [employer] ? ? ? ? ?

## 2021-06-02 NOTE — Progress Notes (Signed)
? ?FOLLOW UP ? ?Date of Service/Encounter:  06/02/21 ? ? ?Assessment:  ? ?Seasonal and perennial allergic rhinitis (grasses, ragweed, weeds, trees, indoor molds, dust mites, dog and cockroach) - tweaking allergen immunotherapy prescription at that ?  ?Food intolerance - now tolerating foods without a problem ?   ?Recurrent infections - with excellent response to Pneumovax ?  ? ?Plan/Recommendations:  ? ?1. Seasonal and perennial allergic rhinitis (grasses, ragweed, weeds, trees, indoor molds, dust mites, dog and cockroach) ?- Stop taking: both nose sprays  ?- Continue with: Allegra (fexofenadine) 180mg  table once daily (can use TWICE DAILY on bad days) ?- Start taking: Ryaltris one spray per nostril twice daily (can use once daily if this is enough to control symptoms) ?- You can use an extra dose of the antihistamine, if needed, for breakthrough symptoms.  ?- We will increase your dog in your next vial to see if this adds some more protection. ? ?2. Recurrent infections ?- You responded SO WELL to the Pneumovax!  ?- Nice work!  ? ?3. Return in about 6 months (around 12/03/2021).  ? ?Subjective:  ? ?Donna Carey is a 46 y.o. female presenting today for follow up of  ?Chief Complaint  ?Patient presents with  ? Follow-up  ? ? ?Donna Carey has a history of the following: ?Patient Active Problem List  ? Diagnosis Date Noted  ? Class 2 obesity with body mass index (BMI) of 37.0 to 37.9 in adult 03/03/2018  ? Osteoarthritis of both knees 12/19/2017  ? Gastroesophageal reflux disease 12/19/2017  ? Allergic rhinitis 09/15/2016  ? Hypertension, essential, benign 09/15/2016  ? Depression, major, single episode, moderate (HCC) 09/15/2016  ? Vitamin D deficiency, unspecified 09/15/2016  ? ? ?History obtained from: chart review and patient. ? ?Donna Carey is a 46 y.o. female presenting for a follow up visit.  She was last seen in June 2022.  At that time, we stopped her Xhance.  We continued with Allegra and  Astelin and restarted Nasacort.  We recommended getting repeat pneumococcal titers in 1 month.  She did get this done and ended up having a very robust response to her Pneumovax. ? ?Since last visit, she has largely done well.  However, she is rather frustrated that her allergies are still an issue despite being on allergy shots.  She does not started her Red Vial.  ? ?[place] is on allergen immunotherapy. She receives one injection. Immunotherapy script #1 contains  ragweed, trees, weeds, dust mites, and dog. She currently receives 0.66mL of the GREEN vial (1/1,000).  ? ?She remains on her nasal sprays 1-2 times a day.  She is also on Allegra.  She does report that she is having some eye itching and burning.  She does not want to add eyedrops because she does not want to take additional medications.  She has never been on Dymista or Ryaltris.  She is open to trying something new. ? ?Her most annoying symptom at this point is her sinus headaches.  These are responsive to both caffeine-containing NSAID combinations as well as nasal sprays.  She has needed 1 antibiotic for sinus infection since the last time we saw her, but she does not think that the Pneumovax has helped boost her immunity.  So she is getting somewhere.  She has no history of migraines.  She has never seen a neurologist. ? ?Otherwise, there have been no changes to her past medical history, surgical history, family history, or social history. ? ? ? ?Review  of Systems  ?Constitutional: Negative.  Negative for chills, fever, malaise/fatigue and weight loss.  ?HENT:  Positive for congestion and sinus pain. Negative for ear discharge and ear pain.   ?Eyes:  Negative for pain, discharge and redness.  ?Respiratory:  Negative for cough, sputum production, shortness of breath and wheezing.   ?Cardiovascular: Negative.  Negative for chest pain and palpitations.  ?Gastrointestinal:  Negative for abdominal pain, constipation, diarrhea, heartburn, nausea and  vomiting.  ?Skin: Negative.  Negative for itching and rash.  ?Neurological:  Positive for headaches. Negative for dizziness, tingling and tremors.  ?Endo/Heme/Allergies:  Positive for environmental allergies. Does not bruise/bleed easily.   ? ? ? ?Objective:  ? ?Blood pressure 126/80, pulse 77, temperature 97.9 ?F (36.6 ?C), resp. rate 18, height 5\' 10"  (1.778 m), weight 265 lb (120.2 kg), SpO2 99 %. ?Body mass index is 38.02 kg/m?. ? ? ? ?Physical Exam ?Vitals reviewed.  ?Constitutional:   ?   Appearance: She is well-developed.  ?   Comments: Very pleasant female.  ?HENT:  ?   Head: Normocephalic and atraumatic.  ?   Right Ear: Tympanic membrane, ear canal and external ear normal.  ?   Left Ear: Tympanic membrane, ear canal and external ear normal.  ?   Nose: Mucosal edema and rhinorrhea present. No nasal deformity or septal deviation.  ?   Right Turbinates: Enlarged, swollen and pale.  ?   Left Turbinates: Enlarged, swollen and pale.  ?   Right Sinus: No maxillary sinus tenderness or frontal sinus tenderness.  ?   Left Sinus: No maxillary sinus tenderness or frontal sinus tenderness.  ?   Mouth/Throat:  ?   Lips: Pink.  ?   Mouth: Mucous membranes are moist. Mucous membranes are not pale and not dry.  ?   Pharynx: Uvula midline.  ?   Comments: Decrease cobblestoning today.  Tonsils absent. ?Eyes:  ?   General: Lids are normal. No allergic shiner.    ?   Right eye: No discharge.     ?   Left eye: No discharge.  ?   Conjunctiva/sclera: Conjunctivae normal.  ?   Right eye: Right conjunctiva is not injected. No chemosis. ?   Left eye: Left conjunctiva is not injected. No chemosis. ?   Pupils: Pupils are equal, round, and reactive to light.  ?Cardiovascular:  ?   Rate and Rhythm: Normal rate and regular rhythm.  ?   Heart sounds: Normal heart sounds.  ?Pulmonary:  ?   Effort: Pulmonary effort is normal. No tachypnea, accessory muscle usage or respiratory distress.  ?   Breath sounds: Normal breath sounds. No wheezing,  rhonchi or rales.  ?   Comments: Moving air well in all lung fields.  No increased work of breathing. ?Chest:  ?   Chest wall: No tenderness.  ?Lymphadenopathy:  ?   Cervical: No cervical adenopathy.  ?Skin: ?   General: Skin is warm.  ?   Capillary Refill: Capillary refill takes less than 2 seconds.  ?   Coloration: Skin is not pale.  ?   Findings: No abrasion, erythema, petechiae or rash. Rash is not papular, urticarial or vesicular.  ?Neurological:  ?   Mental Status: She is alert.  ?Psychiatric:     ?   Behavior: Behavior is cooperative.  ?  ? ?Diagnostic studies: none ? ? ? ?  ? , MD  ?[facility] ? ? ? ? ? ? ?

## 2021-06-03 NOTE — Progress Notes (Signed)
HOLD UNTIL PT NEEDS NEW VIAL. ?

## 2021-06-03 NOTE — Progress Notes (Signed)
Aeroallergen Immunotherapy (**NOTE NEW SCRIPT TO REPLACE CURRENT SCRIPT WHEN NEW VIAL IS NEEDED**)  ? ?Ordering Provider: Dr. Malachi Bonds  ? ?Patient Details  ?Name: Donna Carey  ?MRN: [id number]  ?Date of Birth: 1975-06-07  ? ?Vial Label: RW/W/T/D/DM  ? ?0.3 ml (Volume)  1:20 Concentration -- Ragweed Mix  ?0.2 ml (Volume)  1:10 Concentration -- Plantain English  ?0.6 ml (Volume)  1:20 Concentration -- Eastern 10 Tree Mix (also Sweet Gum)  ?0.8 ml (Volume)  1:10 Concentration -- Dog Epithelia  ?0.8 ml (Volume)   AU Concentration -- Mite Mix (DF 5,000 & DP 5,000)  ? ? ?2.7  ml Extract Subtotal  ?2.3  ml Diluent  ?5.0  ml Maintenance Total  ? ?Schedule:  A  ?Red Vial (1:100): Schedule A (10 doses)  ? ?Special Instructions: Start at Red 0.025 mL and advance on Schedule A ?

## 2021-06-04 ENCOUNTER — Ambulatory Visit (INDEPENDENT_AMBULATORY_CARE_PROVIDER_SITE_OTHER): Payer: No Typology Code available for payment source

## 2021-06-04 ENCOUNTER — Other Ambulatory Visit: Payer: Self-pay

## 2021-06-04 DIAGNOSIS — J309 Allergic rhinitis, unspecified: Secondary | ICD-10-CM | POA: Diagnosis not present

## 2021-06-09 ENCOUNTER — Encounter: Payer: Self-pay | Admitting: Allergy & Immunology

## 2021-06-09 MED ORDER — RYALTRIS 665-25 MCG/ACT NA SUSP: 1.0000 | Freq: Two times a day (BID) | NASAL | 1 refills | Status: DC

## 2021-06-09 NOTE — Telephone Encounter (Signed)
Okay to sent Rx for Ryaltris? ?

## 2021-06-11 ENCOUNTER — Other Ambulatory Visit: Payer: Self-pay

## 2021-06-11 ENCOUNTER — Ambulatory Visit (INDEPENDENT_AMBULATORY_CARE_PROVIDER_SITE_OTHER): Payer: No Typology Code available for payment source

## 2021-06-11 DIAGNOSIS — J309 Allergic rhinitis, unspecified: Secondary | ICD-10-CM

## 2021-06-18 ENCOUNTER — Ambulatory Visit (INDEPENDENT_AMBULATORY_CARE_PROVIDER_SITE_OTHER): Payer: No Typology Code available for payment source

## 2021-06-18 DIAGNOSIS — J309 Allergic rhinitis, unspecified: Secondary | ICD-10-CM

## 2021-06-25 ENCOUNTER — Ambulatory Visit (INDEPENDENT_AMBULATORY_CARE_PROVIDER_SITE_OTHER): Payer: No Typology Code available for payment source

## 2021-06-25 DIAGNOSIS — J309 Allergic rhinitis, unspecified: Secondary | ICD-10-CM | POA: Diagnosis not present

## 2021-07-02 ENCOUNTER — Ambulatory Visit (INDEPENDENT_AMBULATORY_CARE_PROVIDER_SITE_OTHER): Payer: No Typology Code available for payment source

## 2021-07-02 DIAGNOSIS — J309 Allergic rhinitis, unspecified: Secondary | ICD-10-CM | POA: Diagnosis not present

## 2021-07-14 ENCOUNTER — Ambulatory Visit (INDEPENDENT_AMBULATORY_CARE_PROVIDER_SITE_OTHER): Payer: No Typology Code available for payment source

## 2021-07-14 DIAGNOSIS — J309 Allergic rhinitis, unspecified: Secondary | ICD-10-CM | POA: Diagnosis not present

## 2021-07-23 ENCOUNTER — Ambulatory Visit (INDEPENDENT_AMBULATORY_CARE_PROVIDER_SITE_OTHER): Payer: No Typology Code available for payment source

## 2021-07-23 DIAGNOSIS — J309 Allergic rhinitis, unspecified: Secondary | ICD-10-CM | POA: Diagnosis not present

## 2021-07-28 ENCOUNTER — Ambulatory Visit (INDEPENDENT_AMBULATORY_CARE_PROVIDER_SITE_OTHER): Payer: No Typology Code available for payment source | Admitting: *Deleted

## 2021-07-28 DIAGNOSIS — J309 Allergic rhinitis, unspecified: Secondary | ICD-10-CM | POA: Diagnosis not present

## 2021-08-06 ENCOUNTER — Ambulatory Visit (INDEPENDENT_AMBULATORY_CARE_PROVIDER_SITE_OTHER): Payer: No Typology Code available for payment source

## 2021-08-06 DIAGNOSIS — J309 Allergic rhinitis, unspecified: Secondary | ICD-10-CM

## 2021-08-13 ENCOUNTER — Ambulatory Visit (INDEPENDENT_AMBULATORY_CARE_PROVIDER_SITE_OTHER): Payer: No Typology Code available for payment source

## 2021-08-13 DIAGNOSIS — J309 Allergic rhinitis, unspecified: Secondary | ICD-10-CM

## 2021-08-18 DIAGNOSIS — J3089 Other allergic rhinitis: Secondary | ICD-10-CM

## 2021-08-18 NOTE — Progress Notes (Signed)
VIAL EXP 08-19-22

## 2021-08-20 ENCOUNTER — Ambulatory Visit (INDEPENDENT_AMBULATORY_CARE_PROVIDER_SITE_OTHER): Payer: No Typology Code available for payment source | Admitting: *Deleted

## 2021-08-20 DIAGNOSIS — J309 Allergic rhinitis, unspecified: Secondary | ICD-10-CM | POA: Diagnosis not present

## 2021-08-27 ENCOUNTER — Ambulatory Visit (INDEPENDENT_AMBULATORY_CARE_PROVIDER_SITE_OTHER): Payer: No Typology Code available for payment source

## 2021-08-27 DIAGNOSIS — J309 Allergic rhinitis, unspecified: Secondary | ICD-10-CM | POA: Diagnosis not present

## 2021-09-03 ENCOUNTER — Ambulatory Visit (INDEPENDENT_AMBULATORY_CARE_PROVIDER_SITE_OTHER): Payer: No Typology Code available for payment source

## 2021-09-03 DIAGNOSIS — J309 Allergic rhinitis, unspecified: Secondary | ICD-10-CM | POA: Diagnosis not present

## 2021-09-08 ENCOUNTER — Telehealth: Payer: Self-pay

## 2021-09-08 NOTE — Telephone Encounter (Signed)
Patient Donna Carey on Sleep Referrals phone that she needed to cancel her appt with Ihor Austin on 6/28 and would call back to reschedule. Appt has been cancelled and [facility] has been notified of appt cancellation

## 2021-09-10 ENCOUNTER — Ambulatory Visit (INDEPENDENT_AMBULATORY_CARE_PROVIDER_SITE_OTHER): Payer: No Typology Code available for payment source

## 2021-09-10 DIAGNOSIS — J309 Allergic rhinitis, unspecified: Secondary | ICD-10-CM

## 2021-09-16 ENCOUNTER — Ambulatory Visit: Payer: No Typology Code available for payment source | Admitting: Adult Health

## 2021-09-24 ENCOUNTER — Ambulatory Visit: Payer: No Typology Code available for payment source

## 2021-10-01 ENCOUNTER — Ambulatory Visit (INDEPENDENT_AMBULATORY_CARE_PROVIDER_SITE_OTHER): Payer: No Typology Code available for payment source

## 2021-10-01 DIAGNOSIS — J309 Allergic rhinitis, unspecified: Secondary | ICD-10-CM | POA: Diagnosis not present

## 2021-10-13 ENCOUNTER — Ambulatory Visit (INDEPENDENT_AMBULATORY_CARE_PROVIDER_SITE_OTHER): Payer: No Typology Code available for payment source

## 2021-10-13 DIAGNOSIS — J309 Allergic rhinitis, unspecified: Secondary | ICD-10-CM

## 2021-10-27 ENCOUNTER — Ambulatory Visit (INDEPENDENT_AMBULATORY_CARE_PROVIDER_SITE_OTHER): Payer: No Typology Code available for payment source

## 2021-10-27 DIAGNOSIS — J309 Allergic rhinitis, unspecified: Secondary | ICD-10-CM

## 2021-11-05 ENCOUNTER — Ambulatory Visit (INDEPENDENT_AMBULATORY_CARE_PROVIDER_SITE_OTHER): Payer: No Typology Code available for payment source

## 2021-11-05 DIAGNOSIS — J309 Allergic rhinitis, unspecified: Secondary | ICD-10-CM | POA: Diagnosis not present

## 2021-11-10 ENCOUNTER — Ambulatory Visit (INDEPENDENT_AMBULATORY_CARE_PROVIDER_SITE_OTHER): Payer: No Typology Code available for payment source

## 2021-11-10 DIAGNOSIS — J309 Allergic rhinitis, unspecified: Secondary | ICD-10-CM | POA: Diagnosis not present

## 2021-11-17 ENCOUNTER — Ambulatory Visit (INDEPENDENT_AMBULATORY_CARE_PROVIDER_SITE_OTHER): Payer: No Typology Code available for payment source

## 2021-11-17 DIAGNOSIS — J309 Allergic rhinitis, unspecified: Secondary | ICD-10-CM

## 2021-11-26 ENCOUNTER — Ambulatory Visit (INDEPENDENT_AMBULATORY_CARE_PROVIDER_SITE_OTHER): Payer: No Typology Code available for payment source

## 2021-11-26 DIAGNOSIS — J309 Allergic rhinitis, unspecified: Secondary | ICD-10-CM

## 2021-12-03 ENCOUNTER — Ambulatory Visit (INDEPENDENT_AMBULATORY_CARE_PROVIDER_SITE_OTHER): Payer: No Typology Code available for payment source

## 2021-12-03 DIAGNOSIS — J309 Allergic rhinitis, unspecified: Secondary | ICD-10-CM | POA: Diagnosis not present

## 2021-12-08 ENCOUNTER — Telehealth: Payer: Self-pay

## 2021-12-08 ENCOUNTER — Ambulatory Visit (INDEPENDENT_AMBULATORY_CARE_PROVIDER_SITE_OTHER): Payer: No Typology Code available for payment source | Admitting: Allergy & Immunology

## 2021-12-08 ENCOUNTER — Encounter: Payer: Self-pay | Admitting: Allergy & Immunology

## 2021-12-08 VITALS — BP 120/70 | HR 79 | Temp 97.8°F | Resp 12 | Wt 255.2 lb

## 2021-12-08 DIAGNOSIS — J3489 Other specified disorders of nose and nasal sinuses: Secondary | ICD-10-CM

## 2021-12-08 DIAGNOSIS — J3089 Other allergic rhinitis: Secondary | ICD-10-CM | POA: Diagnosis not present

## 2021-12-08 DIAGNOSIS — J329 Chronic sinusitis, unspecified: Secondary | ICD-10-CM

## 2021-12-08 MED ORDER — IPRATROPIUM BROMIDE 0.06 % NA SOLN: 2.0000 | Freq: Three times a day (TID) | NASAL | 5 refills | Status: DC

## 2021-12-08 NOTE — Telephone Encounter (Signed)
Patient Plans to get her allergy shot in [place] on 12/10/21, but would like her vials transferred to the [place] office due to her change in her job location. The late clinic days on tuesday and Thursday work with her schedule more.

## 2021-12-08 NOTE — Progress Notes (Signed)
FOLLOW UP  Date of Service/Encounter:  12/08/21   Assessment:   Seasonal and perennial allergic rhinitis (grasses, ragweed, weeds, trees, indoor molds, dust mites, dog and cockroach) - on allergen immunotherapy with maintenance reached in March 2023    Food intolerance - now tolerating foods without a problem    Recurrent infections - with excellent response to Pneumovax    Plan/Recommendations:   1. Seasonal and perennial allergic rhinitis (grasses, ragweed, weeds, trees, indoor molds, dust mites, dog and cockroach) - Continue with: Allegra (fexofenadine) 180mg  tablet once daily (can use TWICE DAILY on bad days) - STOP taking TEMPORARILY: Ryaltris one spray per nostril twice daily (can use once daily if this is enough to control symptoms) - Start taking: ipratropium one spray per nostril three times daily - You can use an extra dose of the antihistamine, if needed, for breakthrough symptoms.   2. Recurrent infections - You responded SO WELL to the Pneumovax!   3. Return in about 6 months (around 06/08/2022).     Subjective:   Donna Carey is a 46 y.o. female presenting today for follow up of  Chief Complaint  Patient presents with   Follow-up    Couple instances over the past year with sinus issues with pain.  Currently have a headache across forehead. Sinus drainage.    Donna Carey has a history of the following: Patient Active Problem List   Diagnosis Date Noted   Class 2 obesity with body mass index (BMI) of 37.0 to 37.9 in adult 03/03/2018   Osteoarthritis of both knees 12/19/2017   Gastroesophageal reflux disease 12/19/2017   Allergic rhinitis 09/15/2016   Hypertension, essential, benign 09/15/2016   Depression, major, single episode, moderate (HCC) 09/15/2016   Vitamin D deficiency, unspecified 09/15/2016    History obtained from: chart review and patient.  Donna Carey is a 46 y.o. female presenting for a follow up visit. She was last  seen in March 2023. At that time, we stopped both radius sprays.  We continued with Allegra up to twice daily. We also started Ryaltris 1 spray per nostril twice daily.  For her if recurrent infections, she responded very well to her Pneumovax.  Since last visit, she has done well. But she continues to have intermittent episodes of sinusitis. She has had 2-3 sinus events with ear pain. She was diagnosed with an ear infection and acute sinusitis. She has had 2 episodes of sinusitis officially.   Donna Carey is on allergen immunotherapy. She receives one injection. Immunotherapy script #1 contains  ragweed, trees, weeds, dust mites, and dog. She currently receives 0.39mL of the RED vial (1/100).  She started shots in July 2022 and reached maintenance in March 2023.  She is currently on the Ryaltris. She is paying $30 per month. She is on the Allegra 1-2 tablets daily. She feels that her ear is draining all of the time. She does feel that the Ryaltris does the best of all of the nose sprays that she has been on.   She has never had a sinus CT. She has seen ENT in the past, most recently Dr. April 2023. Years ago, she was evaluated for a deviated septum but this was never surgically corrected. She had antibiotics on the following dates: August 2023 (Augmentin) and December 2022 (azithromycin). She does not get prednisone for these symptoms. She does have constant congestion with postnasal drip and sinus pressure. However she has sinusitis like symptoms around monthly.   She is working losing weight  at a wellness center. She is not great about using her CPAP.   She on [employer] at [facility], but she is now moving to [facility]. She is going to be working in [employer]. She is no longer working at [employer] office. They are renting a house in [place] and the house they are building is between [address] and the [place].   Otherwise, there have been no changes to her past medical  history, surgical history, family history, or social history.    Review of Systems  Constitutional: Negative.  Negative for chills, fever, malaise/fatigue and weight loss.  HENT:  Positive for congestion and sinus pain. Negative for ear discharge and ear pain.   Eyes:  Negative for pain, discharge and redness.  Respiratory:  Negative for cough, sputum production, shortness of breath and wheezing.   Cardiovascular: Negative.  Negative for chest pain and palpitations.  Gastrointestinal:  Negative for abdominal pain, constipation, diarrhea, heartburn, nausea and vomiting.  Skin: Negative.  Negative for itching and rash.  Neurological:  Negative for dizziness and headaches.  Endo/Heme/Allergies:  Positive for environmental allergies. Does not bruise/bleed easily.       Objective:   Blood pressure 120/70, pulse 79, temperature 97.8 F (36.6 C), temperature source Temporal, resp. rate 12, weight 255 lb 3.2 oz (115.8 kg), SpO2 98 %. Body mass index is 36.62 kg/m.    Physical Exam Vitals reviewed.  Constitutional:      Appearance: She is well-developed.     Comments: Very pleasant female.  HENT:     Head: Normocephalic and atraumatic.     Right Ear: Tympanic membrane, ear canal and external ear normal.     Left Ear: Tympanic membrane, ear canal and external ear normal.     Nose: Mucosal edema and rhinorrhea present. No nasal deformity or septal deviation.     Right Turbinates: Enlarged, swollen and pale.     Left Turbinates: Enlarged, swollen and pale.     Right Sinus: No maxillary sinus tenderness or frontal sinus tenderness.     Left Sinus: No maxillary sinus tenderness or frontal sinus tenderness.     Comments: No nasal polyps appreciated, but she does have enlarged turbinates.     Mouth/Throat:     Lips: Pink.     Mouth: Mucous membranes are moist. Mucous membranes are not pale and not dry.     Pharynx: Uvula midline.     Comments: Cobblestoning present today.  Tonsils  absent. Eyes:     General: Lids are normal. No allergic shiner.       Right eye: No discharge.        Left eye: No discharge.     Conjunctiva/sclera: Conjunctivae normal.     Right eye: Right conjunctiva is not injected. No chemosis.    Left eye: Left conjunctiva is not injected. No chemosis.    Pupils: Pupils are equal, round, and reactive to light.  Cardiovascular:     Rate and Rhythm: Normal rate and regular rhythm.     Heart sounds: Normal heart sounds.  Pulmonary:     Effort: Pulmonary effort is normal. No tachypnea, accessory muscle usage or respiratory distress.     Breath sounds: Normal breath sounds. No wheezing, rhonchi or rales.     Comments: Moving air well in all lung fields.  No increased work of breathing. Chest:     Chest wall: No tenderness.  Lymphadenopathy:     Cervical: No cervical  adenopathy.  Skin:    General: Skin is warm.     Capillary Refill: Capillary refill takes less than 2 seconds.     Coloration: Skin is not pale.     Findings: No abrasion, erythema, petechiae or rash. Rash is not papular, urticarial or vesicular.  Neurological:     Mental Status: She is alert.  Psychiatric:        Behavior: Behavior is cooperative.      Diagnostic studies: none        Salvatore Marvel, MD  [facility] [place]

## 2021-12-08 NOTE — Patient Instructions (Addendum)
1. Seasonal and perennial allergic rhinitis (grasses, ragweed, weeds, trees, indoor molds, dust mites, dog and cockroach) - Continue with: Allegra (fexofenadine) 180mg  table once daily (can use TWICE DAILY on bad days) - STOP taking TEMPORARILY: Ryaltris one spray per nostril twice daily (can use once daily if this is enough to control symptoms) - Start taking: ipratropium one spray per nostril three times daily - You can use an extra dose of the antihistamine, if needed, for breakthrough symptoms.   2. Recurrent infections - You responded SO WELL to the Pneumovax!   3. Return in about 6 months (around 06/08/2022).    Please inform us of any Emergency Department visits, hospitalizations, or changes in symptoms. Call us before going to the ED for breathing or allergy symptoms since we might be able to fit you in for a sick visit. Feel free to contact us anytime with any questions, problems, or concerns.  It was a pleasure to see you again today!  Websites that have reliable patient information: 1. American Academy of Asthma, Allergy, and Immunology: www.aaaai.org 2. Food Allergy Research and Education (FARE): foodallergy.org 3. Mothers of Asthmatics: http://www.asthmacommunitynetwork.org 4. American College of Allergy, Asthma, and Immunology: www.acaai.org   COVID-19 Vaccine Information can be found at: ShippingScam.co.uk For questions related to vaccine distribution or appointments, please email [email or web address] or call [phone].   We realize that you might be concerned about having an allergic reaction to the COVID19 vaccines. To help with that concern, WE ARE OFFERING THE COVID19 VACCINES IN OUR OFFICE! Ask the front desk for dates!     "Like" [place] on Facebook and Instagram for our latest updates!      A healthy democracy works best when [employer] participate! Make sure you are registered to vote! If you have moved  or changed any of your contact information, you will need to get this updated before voting!  In some cases, you MAY be able to register to vote online: [email or web address]

## 2021-12-09 ENCOUNTER — Encounter: Payer: Self-pay | Admitting: Allergy & Immunology

## 2021-12-09 NOTE — Progress Notes (Signed)
Spoke to patient to find out which imaging center she would prefer to go to. Patient stated that [facility] would be better because the [facility] one was more expensive. Location of order has been changed.

## 2021-12-09 NOTE — Telephone Encounter (Signed)
Request to transfer vial has been updated in the patients allergen flow sheet.

## 2021-12-09 NOTE — Addendum Note (Signed)
Addended by: Eloy End D on: 12/09/2021 11:31 AM   Modules accepted: Orders

## 2021-12-09 NOTE — Progress Notes (Signed)
Patient has been notified and order has been changed to fit the preferred imaging center.

## 2021-12-10 ENCOUNTER — Ambulatory Visit (INDEPENDENT_AMBULATORY_CARE_PROVIDER_SITE_OTHER): Payer: No Typology Code available for payment source

## 2021-12-10 DIAGNOSIS — J309 Allergic rhinitis, unspecified: Secondary | ICD-10-CM

## 2021-12-10 NOTE — Telephone Encounter (Signed)
Called patient's insurance and started the prior auth for CT scan. Reference number is [id number]. Clinical notes need to be faxed to [phone]. Notes have been printed and ready to be faxed tomorrow at different location since the fax machine is currently not available.

## 2021-12-11 ENCOUNTER — Telehealth: Payer: Self-pay | Admitting: Allergy & Immunology

## 2021-12-11 DIAGNOSIS — R220 Localized swelling, mass and lump, head: Secondary | ICD-10-CM

## 2021-12-11 NOTE — Telephone Encounter (Signed)
Called and left a detailed voicemail per DPR permission advising.  

## 2021-12-11 NOTE — Telephone Encounter (Signed)
Patient Called [facility] Dr. Ernst Bowler ordered a CT Scan for her but her insurance requires a PA please advise

## 2021-12-11 NOTE — Telephone Encounter (Signed)
Patient came in to get allergy injection Thursday 12/10/21 and I told her due to [facility] relocating, I'll get her vials to [facility] by next Wednesday so she can get her next injection next Thursday 12/17/21.

## 2021-12-11 NOTE — Telephone Encounter (Signed)
Per Tracyee:  Called patient's insurance and started the prior auth for CT scan. Reference number is [id number]. Clinical notes need to be faxed to [phone]. Notes have been printed and ready to be faxed tomorrow at different location since the fax machine is currently not available.  Called patient's insurance and started the prior auth for CT scan. Reference number is [id number]. Clinical notes need to be faxed to [phone]. Notes have been printed and ready to be faxed tomorrow at different location since the fax machine is currently not available.

## 2021-12-17 ENCOUNTER — Ambulatory Visit (INDEPENDENT_AMBULATORY_CARE_PROVIDER_SITE_OTHER): Payer: No Typology Code available for payment source

## 2021-12-17 DIAGNOSIS — J309 Allergic rhinitis, unspecified: Secondary | ICD-10-CM | POA: Diagnosis not present

## 2021-12-17 NOTE — Progress Notes (Signed)
VIAL EXP 12-18-22 

## 2021-12-17 NOTE — Telephone Encounter (Signed)
[employer] - [phone] ext. [phone] - DOB verified - Ref# [id number]  stated patient office notes were never received on 12/10/21.  Advised I would refax office notes to her attention @ [phone]. Confirmation email received advising fax was received.  PA is being reopened and reviewed for approval.

## 2021-12-18 DIAGNOSIS — J3089 Other allergic rhinitis: Secondary | ICD-10-CM | POA: Diagnosis not present

## 2021-12-21 NOTE — Telephone Encounter (Signed)
Fax received stating that CT has been approved from 12/21/21 - 03/21/22. Informed patient of approval. Copy of approval is in bulk scanning.

## 2021-12-24 ENCOUNTER — Ambulatory Visit (INDEPENDENT_AMBULATORY_CARE_PROVIDER_SITE_OTHER): Payer: No Typology Code available for payment source

## 2021-12-24 DIAGNOSIS — J309 Allergic rhinitis, unspecified: Secondary | ICD-10-CM | POA: Diagnosis not present

## 2021-12-28 ENCOUNTER — Ambulatory Visit
Admission: RE | Admit: 2021-12-28 | Discharge: 2021-12-28 | Disposition: A | Discharge status: Home / Self Care | Payer: No Typology Code available for payment source | Source: Ambulatory Visit | Attending: Allergy & Immunology | Admitting: Allergy & Immunology

## 2021-12-31 NOTE — Telephone Encounter (Signed)
[employer] - [phone] ext. [phone] regarding request for a CT Head for patient due to results from CT Maxillofacial w/o contrast.

## 2021-12-31 NOTE — Telephone Encounter (Signed)
Head CT ordered to follow up on frontal sinus mass. Can we work on a PA?   Salvatore Marvel, MD All[facility] and [place] [facility] [place]

## 2021-12-31 NOTE — Telephone Encounter (Signed)
Will work on [place] for CT today.

## 2021-12-31 NOTE — Addendum Note (Signed)
Addended by: Valentina Shaggy on: 12/31/2021 06:11 AM   Modules accepted: Orders

## 2022-01-05 ENCOUNTER — Ambulatory Visit (INDEPENDENT_AMBULATORY_CARE_PROVIDER_SITE_OTHER): Payer: No Typology Code available for payment source

## 2022-01-05 DIAGNOSIS — J309 Allergic rhinitis, unspecified: Secondary | ICD-10-CM | POA: Diagnosis not present

## 2022-01-05 NOTE — Telephone Encounter (Signed)
I called patient's insurance and initiated PA for CT Head with and without contrast. They need office notes to be faxed to [facility] at [phone], Reference #[id number]. I have current clinic notes from last CT that I can attach, can you please write a letter for documentation purposes as to why the patient would need and benefit from the CT Head with and without contrast?

## 2022-01-07 NOTE — Telephone Encounter (Signed)
Records, letter, and office notes have been faxed to [facility] at [phone] and is currently pending.

## 2022-01-08 ENCOUNTER — Telehealth: Payer: Self-pay | Admitting: Allergy & Immunology

## 2022-01-08 NOTE — Telephone Encounter (Signed)
Called and left a detailed voicemail per DPR permission advising patient that I am working on PA for CT scan and will call her with an update.  

## 2022-01-08 NOTE — Telephone Encounter (Signed)
Patient stated radiologist wanted another scan done because something was found on the first CT Scan. Patient was notified that this scan was not approved because the insurance company needs clinical notes to be sent. Patients call back number is [phone].

## 2022-01-08 NOTE — Telephone Encounter (Signed)
Called and left a detailed voicemail per DPR permission advising patient that I am working on PA for CT scan and will call her with an update.

## 2022-01-11 NOTE — Telephone Encounter (Signed)
PA is still pending.  

## 2022-01-12 ENCOUNTER — Ambulatory Visit (INDEPENDENT_AMBULATORY_CARE_PROVIDER_SITE_OTHER): Payer: No Typology Code available for payment source | Admitting: *Deleted

## 2022-01-12 DIAGNOSIS — J309 Allergic rhinitis, unspecified: Secondary | ICD-10-CM | POA: Diagnosis not present

## 2022-01-14 NOTE — Telephone Encounter (Signed)
Called and spoke with [employer] and the woman stated that she was not able to find the PA for the CT Scan of the Head and that she is going to escalate the claim and that it can take 7 business days for a determination. Will call Monday for a follow up on the PA.

## 2022-01-19 ENCOUNTER — Ambulatory Visit (INDEPENDENT_AMBULATORY_CARE_PROVIDER_SITE_OTHER): Payer: No Typology Code available for payment source | Admitting: *Deleted

## 2022-01-19 DIAGNOSIS — J309 Allergic rhinitis, unspecified: Secondary | ICD-10-CM

## 2022-01-20 NOTE — Telephone Encounter (Signed)
I called [employer] and was directed to the RN for the claim whom stated it was denied because she did not receive any of the office notes, even though I did receive confirmation that the fax went through. I re-faxed the information to the nurse and will follow up for further determination. Nurse name Kristine Garbe, RN, phone number [phone] EXT [phone].

## 2022-01-21 NOTE — Telephone Encounter (Signed)
Head CT has been approved, approval number [id number]. Called patient and left a voicemail informing. Patient has been scheduled for Head CT at [facility]. Approval has been labeled and placed in bulk scanning.

## 2022-01-26 ENCOUNTER — Ambulatory Visit (INDEPENDENT_AMBULATORY_CARE_PROVIDER_SITE_OTHER): Payer: No Typology Code available for payment source

## 2022-01-26 DIAGNOSIS — J309 Allergic rhinitis, unspecified: Secondary | ICD-10-CM | POA: Diagnosis not present

## 2022-02-02 ENCOUNTER — Ambulatory Visit (INDEPENDENT_AMBULATORY_CARE_PROVIDER_SITE_OTHER): Payer: No Typology Code available for payment source

## 2022-02-02 DIAGNOSIS — J309 Allergic rhinitis, unspecified: Secondary | ICD-10-CM | POA: Diagnosis not present

## 2022-02-16 ENCOUNTER — Ambulatory Visit (INDEPENDENT_AMBULATORY_CARE_PROVIDER_SITE_OTHER): Payer: No Typology Code available for payment source | Admitting: *Deleted

## 2022-02-16 DIAGNOSIS — J309 Allergic rhinitis, unspecified: Secondary | ICD-10-CM

## 2022-02-22 ENCOUNTER — Ambulatory Visit
Admission: RE | Admit: 2022-02-22 | Discharge: 2022-02-22 | Disposition: A | Discharge status: Home / Self Care | Payer: No Typology Code available for payment source | Source: Ambulatory Visit | Attending: Allergy & Immunology | Admitting: Allergy & Immunology

## 2022-02-22 DIAGNOSIS — R220 Localized swelling, mass and lump, head: Secondary | ICD-10-CM

## 2022-02-22 MED ORDER — IOPAMIDOL (ISOVUE-370) INJECTION 76%
68.0000 mL | Freq: Once | INTRAVENOUS | Status: AC | PRN
  Administered 2022-02-22: 68 mL via INTRAVENOUS

## 2022-02-24 ENCOUNTER — Encounter: Payer: Self-pay | Admitting: Allergy & Immunology

## 2022-02-24 DIAGNOSIS — R519 Headache, unspecified: Secondary | ICD-10-CM

## 2022-02-24 DIAGNOSIS — G9389 Other specified disorders of brain: Secondary | ICD-10-CM

## 2022-02-24 NOTE — Progress Notes (Signed)
My Chart message sent

## 2022-02-25 NOTE — Telephone Encounter (Signed)
Patient called in and stated she would like the referral and has been to [facility] in the past but if you have somewhere else that you think is better she is open tot hat as well.

## 2022-03-08 ENCOUNTER — Encounter: Payer: Self-pay | Admitting: Allergy & Immunology

## 2022-03-09 ENCOUNTER — Ambulatory Visit (INDEPENDENT_AMBULATORY_CARE_PROVIDER_SITE_OTHER): Payer: No Typology Code available for payment source

## 2022-03-09 DIAGNOSIS — J309 Allergic rhinitis, unspecified: Secondary | ICD-10-CM

## 2022-03-17 DIAGNOSIS — J3089 Other allergic rhinitis: Secondary | ICD-10-CM

## 2022-03-17 NOTE — Progress Notes (Signed)
VIAL EXP 03-18-23 

## 2022-03-23 ENCOUNTER — Ambulatory Visit (INDEPENDENT_AMBULATORY_CARE_PROVIDER_SITE_OTHER): Payer: No Typology Code available for payment source

## 2022-03-23 DIAGNOSIS — J309 Allergic rhinitis, unspecified: Secondary | ICD-10-CM | POA: Diagnosis not present

## 2022-03-24 ENCOUNTER — Ambulatory Visit: Payer: No Typology Code available for payment source | Admitting: Neurology

## 2022-03-24 ENCOUNTER — Telehealth: Payer: Self-pay | Admitting: Neurology

## 2022-03-24 ENCOUNTER — Encounter: Payer: Self-pay | Admitting: Neurology

## 2022-03-24 VITALS — BP 124/76 | HR 80 | Ht 69.0 in | Wt 242.8 lb

## 2022-03-24 DIAGNOSIS — Q049 Congenital malformation of brain, unspecified: Secondary | ICD-10-CM

## 2022-03-24 DIAGNOSIS — G4733 Obstructive sleep apnea (adult) (pediatric): Secondary | ICD-10-CM | POA: Diagnosis not present

## 2022-03-24 DIAGNOSIS — R519 Headache, unspecified: Secondary | ICD-10-CM | POA: Diagnosis not present

## 2022-03-24 DIAGNOSIS — Z789 Other specified health status: Secondary | ICD-10-CM

## 2022-03-24 DIAGNOSIS — G44221 Chronic tension-type headache, intractable: Secondary | ICD-10-CM

## 2022-03-24 DIAGNOSIS — F419 Anxiety disorder, unspecified: Secondary | ICD-10-CM

## 2022-03-24 DIAGNOSIS — M26609 Unspecified temporomandibular joint disorder, unspecified side: Secondary | ICD-10-CM

## 2022-03-24 NOTE — Telephone Encounter (Signed)
[employer] sent to GI they obtain auth [phone] 

## 2022-03-24 NOTE — Telephone Encounter (Addendum)
Referral for Dentistry fax to [facility].  Phone: [phone], Fax: [phone]. 

## 2022-03-24 NOTE — Progress Notes (Signed)
Subjective:    Patient ID: Arti Trang is a 47 y.o. female.  HPI    Star Age, MD, PhD [facility] [address], [address] [place], [place] [address]  Ms. Hartsfield is a 47 year old right-handed woman with an underlying medical history of allergic rhinitis, hyperlipidemia, hypertension, anxiety, depression, and obesity, who presents for a new problem visit. She presents for evaluation of recurrent headaches.  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  She is referred by her allergy specialist, Dr. Ernst Bowler.  The patient is unaccompanied today. We have seen her in sleep clinic before. I first met her at the request of her allergy and asthma specialist on 09/16/2020, at which time she reported morning headaches, snoring and daytime somnolence.  She had a home sleep test on 10/27/2020, which indicated moderate obstructive sleep apnea with an AHI of 27.5/h, O2 nadir 82%.  She was advised to proceed with home AutoPap therapy.  She last saw Frann Rider, NP on 03/18/2021, at which time she reported having difficulty tolerating the nasal mask and had difficulty falling asleep.  She was not fully compliant with her AutoPap machine.  She was advised to try a different nasal interface.    Today, 03/24/2022: I reviewed her CT scan images myself.  She reports having a longstanding history of recurrent headaches which are typically pressure-like and in the frontal areas.  Sometimes when the headache is severe she has light sensitivity, but not always.  She does have visual disturbance and over the past several years her vision has perpetually become worse and she needs changes to her prescription eyeglasses almost every year.  She has an appointment coming up with her eye doctor for routine follow-up.  She has a family history of cataracts, her sister had cataract surgery in her early 67s.  Patient does not have any glaucoma or cataracts.  She does grind  her teeth and uses a bite guard.  She hydrates well with water.  She limits her caffeine to 1 cup of coffee in the morning and 1 or 2 sodas during the day.  Of note, she has been on phentermine for the past 6 months or so.  She has been on Cymbalta 60 mg strength for the past year or so.  Cymbalta has really helped with her anxiety but she still has stress and residual anxiety by self admission.  She has never had any strokelike symptoms such as one-sided weakness or numbness or tingling or droopy face or slurring of speech.  She has no history of childhood physical trauma/injuries or in utero complications as far she knows.  She gave back CPAP machine to her DME provider.  She reports that she could not tolerate PAP therapy.  She would be willing to consider alternative treatment options.  She is working on weight loss and has lost about 20 pounds thus far.  She had a head CT with and without contrast on 02/22/2022 and I reviewed the results: IMPRESSION: 1. Large cystic area at the left frontal convexity, measuring 4.3 x 4.0 cm, with ex vacuo dilatation of the frontal horn of the left lateral ventricle. 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 2. No acute hemorrhage or other acute intracranial abnormality.  She had a CT maxillofacial without contrast on 12/28/2021 and I reviewed the results: IMPRESSION: 1. No sinus disease. 2. No acute displaced facial fracture. 3. Left frontal 7.4 cm  hypodensity measuring similar density [place] cerebrospinal fluid. Associated calcification. Recommend correlation with prior cross-sectional imaging as this is likely a chronic finding. If not available, recommend CT head for further evaluation.  Previously:   09/16/20: (She) reports snoring and excessive daytime somnolence as well as morning headaches.  I reviewed your office note from 08/29/2020.  Her Epworth sleepiness score is 3 out of 24, fatigue  severity score is 48 out of 63.  She has trouble going to sleep, this is a chronic problem.  She has tried over-the-counter Unisom but has not tried melatonin.  She has been on Celexa for anxiety which has helped.  She has been on Celexa for years.  She has woken up with a headache.  She tries to get enough sleep, typically in bed between 9 and 10 PM and rise time is between 5:15 AM and 7 AM.  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  She has undergone tonsillectomy last year for prolonged infection and tonsillar stones.  She was also diagnosed with deviated septum.  She has a history of bruxism and uses currently an over-the-counter occlusal guard but has had a dentist made bite guard before.  She drinks caffeine in the form of coffee, 1 or 2 cups/day, no daily soda or tea.  She has a TV in the bedroom but does not typically use it.  She is a non-smoker and drinks alcohol occasionally.  She denies any telltale symptoms of restless leg syndrome but her husband has reported that she twitches in her sleep when she has a history of sleep talking.  She does not have night to night nocturia.  She has not woken up with a sense of gasping for air.  She is not aware of any family history of sleep apnea.  She lives with her husband, they have no children.  She has 2 dogs and 3 cats in the household.   Her Past Medical History Is Significant For: Past Medical History:  Diagnosis Date   Allergy    Anxiety    Depression    Hyperlipidemia    Hypertension    Keratosis pilaris     Her Past Surgical History Is Significant For: Past Surgical History:  Procedure Laterality Date   TONSILLECTOMY Bilateral 01/01/2020   Procedure: TONSILLECTOMY;  Surgeon: Rozetta Nunnery, MD;  Location: [facility];  Service: ENT;  Laterality: Bilateral;   TONSILLECTOMY     TONSILLECTOMY Bilateral 12/2019   WISDOM TOOTH EXTRACTION      Her Family History Is  Significant For: Family History  Problem Relation Age of Onset   Allergic rhinitis Mother    Allergic rhinitis Father    Allergic rhinitis Sister    Migraines Sister    Mental illness Maternal Grandmother        schizophrenia   Cancer Paternal Grandmother        ovarian ,breast,lung   Breast cancer Paternal Grandmother    Sleep apnea Neg Hx     Her Social History Is Significant For: Social History   Socioeconomic History   Marital status: Married    Spouse name: Not on file   Number of children: Not on file   Years of education: Not on file   Highest education level: Not on file  Occupational History   Not on file  Tobacco Use   Smoking status: Never   Smokeless tobacco: Never  Vaping Use   Vaping Use:  Never used  Substance and Sexual Activity   Alcohol use: Yes    Comment: rarely   Drug use: No   Sexual activity: Yes  Other Topics Concern   Not on file  Social History Narrative   Not on file   Social Determinants of Health   Financial Resource Strain: Not on file  Food Insecurity: Not on file  Transportation Needs: Not on file  Physical Activity: Not on file  Stress: Not on file  Social Connections: Not on file    Her Allergies Are:  Allergies  Allergen Reactions   Codeine Nausea Only   Fish Allergy     Eel Only   Molds & Smuts   :   Her Current Medications Are:  Outpatient Encounter Medications as of 03/24/2022  Medication Sig   Cholecalciferol (VITAMIN D3) 2000 units TABS Take 1 tablet by mouth daily.   colestipol (COLESTID) 1 g tablet Take 1 g by mouth 2 (two) times daily.   DULoxetine (CYMBALTA) 60 MG capsule Take 60 mg by mouth daily.   Fexofenadine HCl (ALLEGRA PO) Take by mouth.   ibuprofen (ADVIL) 200 MG tablet Take 200 mg by mouth every 6 (six) hours as needed.   ipratropium (ATROVENT) 0.06 % nasal spray Place 2 sprays into both nostrils 3 (three) times daily. (Patient taking differently: Place 2 sprays into both nostrils as needed for  rhinitis.)   losartan (COZAAR) 50 MG tablet Take 50 mg by mouth daily.   Olopatadine-Mometasone (RYALTRIS) [id number] MCG/ACT SUSP Place 1 spray into the nose 2 (two) times daily.   omeprazole (PRILOSEC) 40 MG capsule Take 40 mg by mouth daily.   phentermine (ADIPEX-P) 37.5 MG tablet Take 37.5 mg by mouth every morning.   rosuvastatin (CRESTOR) 5 MG tablet Take 5 mg by mouth daily.   Triamcinolone Acetonide (NASACORT ALLERGY 24HR NA) Place into the nose.   vitamin B-12 (CYANOCOBALAMIN) 1000 MCG tablet 1 tablet   azelastine (ASTELIN) 0.1 % nasal spray Place 1 spray into both nostrils daily. Use in each nostril as directed   WEGOVY 0.25 MG/0.5ML SOAJ SMARTSIG:1 pre-filled pen syringe SUB-Q Once a Week   No facility-administered encounter medications on file as of 03/24/2022.  :   Review of Systems:  Out of a complete 14 point review of systems, all are reviewed and negative with the exception of these symptoms as listed below:   Review of Systems  Neurological:        Pt states here for results of CT scan Pt states 20 headaches in last month. Pt states headache pain is always in forehead Pt states no longer has CPAP machine could not wear mask. Pt states tried several different mask. Pt states sent CPAP machine back to DME    ESS: 4     Objective:  Neurological Exam  Physical Exam Physical Examination:   Vitals:   03/24/22 0738  BP: 124/76  Pulse: 80    General Examination: The patient is a very pleasant 47 y.o. female in no acute distress. She appears well-developed and well-nourished and well groomed.   HEENT: Normocephalic, atraumatic, pupils are equal, round and reactive to light, corrective eyeglasses in place.  Extraocular tracking is good without limitation to gaze excursion or nystagmus noted.  Funduscopic exam is normal.  No obvious photophobia.  Hearing is grossly intact with tuning fork. Face is symmetric with normal facial animation and normal facial sensation to light  touch, temperature and vibration. Speech is clear with no dysarthria noted.  There is no hypophonia. There is no lip, neck/head, jaw or voice tremor. Neck is supple with full range of passive and active motion. There are no carotid bruits on auscultation. Oropharynx exam reveals: No significant mouth dryness, good dental hygiene.  Tonsils absent.  Tongue protrudes centrally and palate elevates symmetrically.     Chest: Clear to auscultation without wheezing, rhonchi or crackles noted.   Heart: S1+S2+0, regular and normal without murmurs, rubs or gallops noted.    Abdomen: Soft, non-tender and non-distended with normal bowel sounds appreciated on auscultation.   Extremities: There is no pitting edema in the distal lower extremities bilaterally.    Skin: Warm and dry without trophic changes noted.    Musculoskeletal: exam reveals no obvious joint deformities, tenderness or joint swelling or erythema.    Neurologically:  Mental status: The patient is awake, alert and oriented in all 4 spheres. Her immediate and remote memory, attention, language skills and fund of knowledge are appropriate. There is no evidence of aphasia, agnosia, apraxia or anomia. Speech is clear with normal prosody and enunciation. Thought process is linear. Mood is normal and affect is normal.  Cranial nerves II - XII are as described above under HEENT exam.  Motor exam: Normal bulk, strength and tone is noted. There is no drift, postural or action tremor, no resting tremor.  No rebound.  Fine motor skills and coordination: Normal finger taps, hand movements and rapid alternating patting in both upper extremities, normal foot taps and both lower extremities.    Cerebellar testing: No dysmetria or intention tremor. There is no truncal or gait ataxia.  Normal finger-to-nose, normal heel-to-shin bilaterally. Reflexes are 2-3+ throughout, toes are downgoing bilaterally. Sensory exam: intact to light touch, temperature and vibration  sense in the upper and lower extremities.  Gait, station and balance: She stands easily. No veering to one side is noted. No leaning to one side is noted. Posture is age-appropriate and stance is narrow based. Gait shows normal stride length and normal pace. No problems turning are noted.  Romberg negative, tandem walk normal.   Assessment and Plan:  In summary, Shanayah Malonie Tatum is a very pleasant 47 year old female with an underlying medical history of allergic rhinitis, hyperlipidemia, hypertension, anxiety, depression, obstructive sleep apnea in the moderate range (not on PAP therapy any longer) and obesity, who presents for evaluation of her longstanding history of recurrent headaches as well as recent finding of left frontal brain anomaly.  As far as her head CT finding, this is most likely an incidental and congenital finding, she does not have any known history of trauma early on in life, no obvious history of in utero complications.  Nevertheless, findings suggest a developmental anomaly/congenital finding, likely without any consequence for her in the grand scheme of things.  She is advised that her headaches are most likely from a combination of factors including residual anxiety and tension headaches, jaw clenching and TMJ dysfunction, sleep disturbance including untreated obstructive sleep apnea, as well as possibility of medication effect from the phentermine.  Her headaches are longer standing, headache character is not classic for migraines but could be an overlap.  She has been on Cymbalta for the past year with good success as far as tolerance but had a difficult transition from Celexa.  There is room to increase the Cymbalta should she have residual anxiety and recurrence of more severe headaches.  We can have her talk to her PCP about this.  She is advised to talk  to her weight management specialist about switching the phentermine out to something else that is not a stimulant as these  can cause headaches.  As far as management of sleep apnea, unfortunately, she was not able to tolerate PAP therapy.  We talked about alternative treatment options, she is already working on weight loss, I would like for [place] to consider an oral appliance.  She is agreeable and I made a referral to dentistry for this.  While her neurological exam is nonfocal and reassuring, I suggested that we proceed with a brain MRI with and without contrast for further reassurance and to rule out a structural change and evaluate in particular the area surrounding the left frontal lobe anomaly.  She is advised to continue to stay well-hydrated, try to get enough rest, work on weight reduction, keep her appointment with her eye doctor that is upcoming and follow-up in this clinic to see one of our nurse practitioners routinely in about 6 months.  I did not suggest adding any new medications quite yet at this time.  I answered all the questions today and the patient and her husband were in agreement.    I spent 45 minutes in total face-to-face time and in reviewing records during pre-charting, more than 50% of which was spent in counseling and coordination of care, reviewing test results, reviewing medications and treatment regimen and/or in discussing or reviewing the diagnosis of recurrent headaches, congenital brain anomaly, OSA, tension headaches, the prognosis and treatment options. Pertinent laboratory and imaging test results that were available during this visit with the patient were reviewed by me and considered in my medical decision making (see chart for details).

## 2022-03-24 NOTE — Patient Instructions (Addendum)
It was nice to see you again.  I am sorry, you have had these headaches and stress over the head CT findings.  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  It is unlikely to grow or becomes something progressive.  It is very possible that you have stable CT scans in the future if you need another scan.  We can always recheck in the future.  We will go ahead and do an MRI of the brain to look at the surrounding brain structure in particular.  Your neurological exam is normal which is also reassuring.  As far as these headaches, it is possible that these are from a combination of factors including untreated obstructive sleep apnea, jaw clenching and TMJ issues, stress/tension headaches, medication effect, particularly from the phentermine.  You have not been able to use the autoPAP. I appreciate you trying AutoPap as your initial treatment option for your moderate obstructive sleep apnea.  We do recommend treating moderate and severe obstructive sleep apnea not just for symptom control but also for cardiovascular prevention in the future.  As discussed, I will refer you to a dentist for consideration for an oral appliance.  I placed a referral to Augustina Mood.  If she is not in network, we can consider sending the referral to someone else. Please continue to work on weight loss but talk to your prescriber about the possibility of switching from phentermine to something else, even trying metformin if need be.   We use Cymbalta and other antidepressants for migraine prevention.  Even though you may not have classic migraines, it may help with headache prevention and you can consider increasing the dose in the future if need be.  You can talk to your prescriber at the next visit.  We will call you with your MRI results.  Please keep your follow-up appointment with your  eye doctor as scheduled.  Please limit your caffeine to 1 or 2 servings per day and continue to stay well-hydrated with water, avoid over indulging on alcohol, limit yourself to less than 1 serving per day if possible.  Follow-up to see the nurse practitioner in our clinic in about 6 months, we will call you with your MRI results in the interim.  If you do not hear back from Dr. Corky Sing office in the next 2 or 3 weeks, please call us back or email [place] through [place].

## 2022-04-07 ENCOUNTER — Encounter: Payer: Self-pay | Admitting: Neurology

## 2022-04-08 ENCOUNTER — Ambulatory Visit (INDEPENDENT_AMBULATORY_CARE_PROVIDER_SITE_OTHER): Payer: No Typology Code available for payment source

## 2022-04-08 DIAGNOSIS — J309 Allergic rhinitis, unspecified: Secondary | ICD-10-CM

## 2022-04-20 ENCOUNTER — Ambulatory Visit (INDEPENDENT_AMBULATORY_CARE_PROVIDER_SITE_OTHER): Payer: No Typology Code available for payment source

## 2022-04-20 DIAGNOSIS — J309 Allergic rhinitis, unspecified: Secondary | ICD-10-CM

## 2022-04-23 ENCOUNTER — Other Ambulatory Visit: Payer: No Typology Code available for payment source

## 2022-05-04 ENCOUNTER — Ambulatory Visit (INDEPENDENT_AMBULATORY_CARE_PROVIDER_SITE_OTHER): Payer: No Typology Code available for payment source

## 2022-05-04 DIAGNOSIS — J309 Allergic rhinitis, unspecified: Secondary | ICD-10-CM | POA: Diagnosis not present

## 2022-05-18 ENCOUNTER — Ambulatory Visit (INDEPENDENT_AMBULATORY_CARE_PROVIDER_SITE_OTHER): Payer: No Typology Code available for payment source

## 2022-05-18 DIAGNOSIS — J309 Allergic rhinitis, unspecified: Secondary | ICD-10-CM | POA: Diagnosis not present

## 2022-05-25 ENCOUNTER — Ambulatory Visit (INDEPENDENT_AMBULATORY_CARE_PROVIDER_SITE_OTHER): Payer: No Typology Code available for payment source

## 2022-05-25 DIAGNOSIS — J309 Allergic rhinitis, unspecified: Secondary | ICD-10-CM | POA: Diagnosis not present

## 2022-05-28 ENCOUNTER — Encounter: Payer: Self-pay | Admitting: Allergy & Immunology

## 2022-06-01 ENCOUNTER — Ambulatory Visit (INDEPENDENT_AMBULATORY_CARE_PROVIDER_SITE_OTHER): Payer: No Typology Code available for payment source

## 2022-06-01 ENCOUNTER — Other Ambulatory Visit: Payer: Self-pay | Admitting: *Deleted

## 2022-06-01 DIAGNOSIS — J309 Allergic rhinitis, unspecified: Secondary | ICD-10-CM | POA: Diagnosis not present

## 2022-06-01 MED ORDER — RYALTRIS 665-25 MCG/ACT NA SUSP: 1.0000 | Freq: Two times a day (BID) | NASAL | 1 refills | Status: DC

## 2022-06-08 ENCOUNTER — Ambulatory Visit (INDEPENDENT_AMBULATORY_CARE_PROVIDER_SITE_OTHER): Payer: No Typology Code available for payment source

## 2022-06-08 DIAGNOSIS — J309 Allergic rhinitis, unspecified: Secondary | ICD-10-CM | POA: Diagnosis not present

## 2022-06-15 ENCOUNTER — Ambulatory Visit (INDEPENDENT_AMBULATORY_CARE_PROVIDER_SITE_OTHER): Payer: No Typology Code available for payment source

## 2022-06-15 DIAGNOSIS — J309 Allergic rhinitis, unspecified: Secondary | ICD-10-CM

## 2022-06-22 ENCOUNTER — Encounter: Payer: Self-pay | Admitting: Allergy & Immunology

## 2022-06-23 ENCOUNTER — Encounter: Payer: Self-pay | Admitting: Allergy

## 2022-06-23 ENCOUNTER — Ambulatory Visit: Payer: No Typology Code available for payment source | Admitting: Allergy

## 2022-06-23 ENCOUNTER — Other Ambulatory Visit: Payer: Self-pay

## 2022-06-23 VITALS — BP 124/76 | HR 71 | Temp 97.5°F | Resp 18

## 2022-06-23 DIAGNOSIS — J0141 Acute recurrent pansinusitis: Secondary | ICD-10-CM

## 2022-06-23 DIAGNOSIS — J3089 Other allergic rhinitis: Secondary | ICD-10-CM

## 2022-06-23 DIAGNOSIS — J302 Other seasonal allergic rhinitis: Secondary | ICD-10-CM | POA: Diagnosis not present

## 2022-06-23 MED ORDER — AMOXICILLIN-POT CLAVULANATE 875-125 MG PO TABS: 1.0000 | ORAL_TABLET | Freq: Two times a day (BID) | ORAL | 0 refills | Status: AC

## 2022-06-23 MED ORDER — IPRATROPIUM BROMIDE 0.06 % NA SOLN: 2.0000 | Freq: Three times a day (TID) | NASAL | 5 refills | Status: DC

## 2022-06-23 NOTE — Patient Instructions (Addendum)
Recurrent infections - currently with sinus infection and will treat with Augmentin 875mg  1 tab twice a day for next 10 days - continue pain control with ibuprofen as needed - get adequate hydration and rest - if needed can take Mucinex to help with mucus drainage  Seasonal and perennial allergic rhinitis (grasses, ragweed, weeds, trees, indoor molds, dust mites, dog and cockroach) - Continue with:  Zyrtec 10mg  table once daily (can use TWICE DAILY on bad days) Ryaltris 1-2 spray per nostril twice daily (can use once daily if this is enough to control symptoms) Ipratropium 1-2 spray per nostril up to three times daily as needed for nasal drainage   Return in about 6 months

## 2022-06-23 NOTE — Progress Notes (Signed)
Follow-up Note  RE: Donna Carey MRN: [id number] DOB: October 24, 1975 Date of Office Visit: 06/23/2022   History of present illness: Donna Carey is a 47 y.o. female presenting today for sick visit.  She has history of allergic rhinitis and recurrent infections.  She was last seen in the office on 12/08/2021 by Dr. Ernst Bowler.  She states since Friday she has been having sore throat, left sided facial pain and teeth pain, swollen LN in neck on left side, increase nasal drainage and nosebleeds.  She may be having some sweats but does not report any frank fevers.  She does work in a healthcare setting.  She has been taking advil for the sore throat.   She has previous history of sinus infections and she states this does feel quite similar to her previous sinus infections.  She states she has been taking her routine medications of zyrtec (recently switched from Delma Freeze has had been on for long time). She is using Ryaltris.  She also has been ipratropium nasal spray but believes she may need it refilled.  She also has perform sinus rinses in the past but has not done so recently. She did receive a Pneumovax and did see a reduction in her recurrent infections afterwards.   Review of systems: Review of Systems  Constitutional:  Positive for diaphoresis and fatigue.  HENT:         See HPI  Eyes: Negative.   Respiratory: Negative.    Cardiovascular: Negative.   Gastrointestinal: Negative.   Musculoskeletal: Negative.   Skin: Negative.   Allergic/Immunologic: Negative.   Neurological: Negative.      All other systems negative unless noted above in HPI  Past medical/social/surgical/family history have been reviewed and are unchanged unless specifically indicated below.  No changes  Medication List: Current Outpatient Medications  Medication Sig Dispense Refill   Cholecalciferol (VITAMIN D3) 2000 units TABS Take 1 tablet by mouth daily.     colestipol (COLESTID) 1 g  tablet Take 1 g by mouth 2 (two) times daily.     DULoxetine (CYMBALTA) 60 MG capsule Take 60 mg by mouth daily.     ibuprofen (ADVIL) 200 MG tablet Take 200 mg by mouth every 6 (six) hours as needed.     losartan (COZAAR) 50 MG tablet Take 50 mg by mouth daily.     omeprazole (PRILOSEC) 40 MG capsule Take 40 mg by mouth daily.     rosuvastatin (CRESTOR) 5 MG tablet Take 5 mg by mouth daily.     RYALTRIS [id number] MCG/ACT SUSP Place 1 spray into the nose 2 (two) times daily. 87 g 1   No current facility-administered medications for this visit.     Known medication allergies: Allergies  Allergen Reactions   Codeine Nausea Only   Fish Allergy     Eel Only   Molds & Smuts      Physical examination: Blood pressure 124/76, pulse 71, temperature (!) 97.5 F (36.4 C), temperature source Temporal, resp. rate 18, SpO2 98 %.  General: Alert, interactive, in no acute distress. HEENT: PERRLA, TMs pearly gray, turbinates moderately edematous with thick discharge, post-pharynx non erythematous. Neck: Mildly tender without lymphadenopathy. Lungs: Clear to auscultation without wheezing, rhonchi or rales. {no increased work of breathing. CV: Normal S1, S2 without murmurs. Abdomen: Nondistended, nontender. Skin: Warm and dry, without lesions or rashes. Extremities:  No clubbing, cyanosis or edema. Neuro:   Grossly intact.  Diagnositics/Labs: None today  Assessment and plan:  Recurrent infections - currently with symptoms consistent with sinus infection and will treat with Augmentin 875mg  1 tab twice a day for next 10 days - continue pain control with ibuprofen as needed - get adequate hydration and rest - if needed can take Mucinex to help with mucus drainage  Seasonal and perennial allergic rhinitis (grasses, ragweed, weeds, trees, indoor molds, dust mites, dog and cockroach) - Continue with:  Zyrtec 10mg  table once daily (can use TWICE DAILY on bad days) Ryaltris 1-2 spray per nostril  twice daily (can use once daily if this is enough to control symptoms) Ipratropium 1-2 spray per nostril up to three times daily as needed for nasal drainage   Return in about 6 months  I appreciate the opportunity to take part in [facility] care. Please do not hesitate to contact me with questions. She is Sincerely,   Prudy Feeler, MD Allergy/Immunology [facility]

## 2022-06-24 NOTE — Telephone Encounter (Signed)
Great - thanks, Levada Dy!

## 2022-07-08 ENCOUNTER — Ambulatory Visit (INDEPENDENT_AMBULATORY_CARE_PROVIDER_SITE_OTHER): Payer: No Typology Code available for payment source

## 2022-07-08 DIAGNOSIS — J309 Allergic rhinitis, unspecified: Secondary | ICD-10-CM

## 2022-07-20 ENCOUNTER — Ambulatory Visit (INDEPENDENT_AMBULATORY_CARE_PROVIDER_SITE_OTHER): Payer: No Typology Code available for payment source

## 2022-07-20 DIAGNOSIS — J309 Allergic rhinitis, unspecified: Secondary | ICD-10-CM

## 2022-07-21 DIAGNOSIS — J3089 Other allergic rhinitis: Secondary | ICD-10-CM | POA: Diagnosis not present

## 2022-07-21 NOTE — Progress Notes (Signed)
EXP 07/21/23 

## 2022-08-03 ENCOUNTER — Ambulatory Visit (INDEPENDENT_AMBULATORY_CARE_PROVIDER_SITE_OTHER): Payer: No Typology Code available for payment source

## 2022-08-03 ENCOUNTER — Encounter: Payer: Self-pay | Admitting: Neurology

## 2022-08-03 DIAGNOSIS — J309 Allergic rhinitis, unspecified: Secondary | ICD-10-CM

## 2022-08-05 ENCOUNTER — Encounter: Payer: Self-pay | Admitting: Allergy & Immunology

## 2022-08-10 ENCOUNTER — Encounter: Payer: Self-pay | Admitting: Allergy & Immunology

## 2022-08-10 ENCOUNTER — Ambulatory Visit: Payer: No Typology Code available for payment source | Admitting: Allergy & Immunology

## 2022-08-10 VITALS — Wt 240.4 lb

## 2022-08-10 DIAGNOSIS — J0101 Acute recurrent maxillary sinusitis: Secondary | ICD-10-CM

## 2022-08-10 DIAGNOSIS — J302 Other seasonal allergic rhinitis: Secondary | ICD-10-CM

## 2022-08-10 DIAGNOSIS — J3089 Other allergic rhinitis: Secondary | ICD-10-CM | POA: Diagnosis not present

## 2022-08-10 DIAGNOSIS — H9202 Otalgia, left ear: Secondary | ICD-10-CM

## 2022-08-10 MED ORDER — CIPROFLOXACIN-DEXAMETHASONE 0.3-0.1 % OT SUSP: 4.0000 [drp] | Freq: Two times a day (BID) | OTIC | 0 refills | Status: AC

## 2022-08-10 MED ORDER — CEFDINIR 300 MG PO CAPS: 300.0000 mg | ORAL_CAPSULE | Freq: Two times a day (BID) | ORAL | 0 refills | Status: AC

## 2022-08-10 NOTE — Progress Notes (Signed)
RE: Donna Carey MRN: [id number] DOB: Sep 27, 1975 Date of Telemedicine Visit: 08/10/2022  Referring provider: Genia Hotter, FNP Primary care provider: Genia Hotter, FNP  Chief Complaint: Sinus Problem (Ear pain - since early May. Sinus infection cleared, but her ear issues has been on/off. Did 3 days of afferin and her ear started to drain after use. )   Telemedicine Follow Up Visit via Telephone: I connected with Aloha Gell for a follow up on 08/10/22 by telephone and verified that I am speaking with the correct person using two identifiers.   I discussed the limitations, risks, security and privacy concerns of performing an evaluation and management service by telephone and the availability of in person appointments. I also discussed with the patient that there may be a patient responsible charge related to this service. The patient expressed understanding and agreed to proceed.  Patient is at work. Provider is at the office.  Visit start time: 2:55 PM Visit end time: 3:12 PM Insurance consent/check in by: Diandra Medical consent and medical assistant/nurse: Diandra  History of Present Illness:  She is a 47 y.o. female, who is being followed for environmental allergies as well as recurrent infections and food intolerance. Her previous allergy office visit was in September 2023 with myself.  She was last seen in person in April 2024 by Dr. Delorse Lek.  At that time, she was treated with Augmentin for a sinus infection.  For her allergic rhinitis, she was continued on Zyrtec as well as Ryaltris and ipratropium.  In the interim, she send [place] a message over the weekend.  She completed the antibiotics and has had on and off ear discomfort since then.  She also has had a short on her left ear.  She completed the Augmentin and it really helped with the sinuses. However, she reports that her ear never really felt better. She felt that she needed wait it out. There is no  drainage outside of the ear, but she does report some fluid deeper into it. She did try Afrin to see if that would help. But there was no improvement. She has pain on the front of the ear and deeper pressure within.  Again, there is been no drainage from the ear themselves.  Her last antibiotic was around 6 months.  She thinks that this was Augmentin as well.  This was given by her PCP for a similar issue, likely a sinus infection.  Allergy shots are going well.  She has not had any issues with these at all.  Colena is on allergen immunotherapy. She receives one injection. Immunotherapy script #1 contains  ragweed, trees, weeds, dust mites, and dog. She currently receives 0.64mL of the RED vial (1/100) every 2 weeks.   Otherwise, there have been no changes to her past medical history, surgical history, family history, or social history.  Assessment and Plan:  [name] is a 47 y.o. female with:  Seasonal and perennial allergic rhinitis (grasses, ragweed, weeds, trees, indoor molds, dust mites, dog and cockroach) - on allergen immunotherapy with maintenance reached in March 2023    Food intolerance - now tolerating foods without a problem    Recurrent infections - with excellent response to Pneumovax  Left-sided ear pain and ear pressure - sending in a second round of antibiotics (cefdinir)   We are going to treat with a couple of weeks of cefdinir to see if this changes anything at all.  She may need another sinus CT scan or referral  to ENT at the very least to help address some of these issues.  I think she has seen ENT in the past, but we can address that if needed because she will need a new referral, as her last ENT, Dr. Ezzard Standing, has retired..  Her allergy shots are going well.  She remains on 0.5 mL every 2 weeks.  Diagnostics: None.  Medication List:  Current Outpatient Medications  Medication Sig Dispense Refill   Cholecalciferol (VITAMIN D3) 2000 units TABS Take 1 tablet by  mouth daily.     colestipol (COLESTID) 1 g tablet Take 1 g by mouth 2 (two) times daily.     DULoxetine (CYMBALTA) 60 MG capsule Take 60 mg by mouth daily.     ibuprofen (ADVIL) 200 MG tablet Take 200 mg by mouth every 6 (six) hours as needed.     ipratropium (ATROVENT) 0.06 % nasal spray Place 2 sprays into both nostrils 3 (three) times daily. 15 mL 5   losartan (COZAAR) 50 MG tablet Take 50 mg by mouth daily.     omeprazole (PRILOSEC) 40 MG capsule Take 40 mg by mouth daily.     rosuvastatin (CRESTOR) 5 MG tablet Take 5 mg by mouth daily.     RYALTRIS [id number] MCG/ACT SUSP Place 1 spray into the nose 2 (two) times daily. 87 g 1   No current facility-administered medications for this visit.   Allergies: Allergies  Allergen Reactions   Codeine Nausea Only   Fish Allergy     Eel Only   Molds & Smuts    I reviewed her past medical history, social history, family history, and environmental history and no significant changes have been reported from previous visits.  Review of Systems  Constitutional:  Negative for activity change and appetite change.  HENT:  Positive for sinus pressure and sinus pain. Negative for congestion, postnasal drip, rhinorrhea and sore throat.        Positive for left ear pain.  Eyes:  Negative for pain, discharge, redness and itching.  Respiratory:  Negative for shortness of breath, wheezing and stridor.   Gastrointestinal:  Negative for diarrhea, nausea and vomiting.  Musculoskeletal:  Negative for arthralgias, joint swelling and myalgias.  Skin:  Negative for rash.  Allergic/Immunologic: Positive for environmental allergies. Negative for food allergies.    Objective:  Physical exam not obtained as encounter was done via telephone.   Previous notes and tests were reviewed.  I discussed the assessment and treatment plan with the patient. The patient was provided an opportunity to ask questions and all were answered. The patient agreed with the plan and  demonstrated an understanding of the instructions.   The patient was advised to call back or seek an in-person evaluation if the symptoms worsen or if the condition fails to improve as anticipated.  I provided 17 minutes of non-face-to-face time during this encounter.  It was my pleasure to participate in [facility] care today. Please feel free to contact me with any questions or concerns.   Sincerely,  Alfonse Spruce, MD

## 2022-08-17 ENCOUNTER — Ambulatory Visit (INDEPENDENT_AMBULATORY_CARE_PROVIDER_SITE_OTHER): Payer: No Typology Code available for payment source | Admitting: *Deleted

## 2022-08-17 DIAGNOSIS — J309 Allergic rhinitis, unspecified: Secondary | ICD-10-CM | POA: Diagnosis not present

## 2022-08-31 ENCOUNTER — Ambulatory Visit: Payer: No Typology Code available for payment source | Admitting: Neurology

## 2022-09-02 ENCOUNTER — Ambulatory Visit (INDEPENDENT_AMBULATORY_CARE_PROVIDER_SITE_OTHER): Payer: No Typology Code available for payment source

## 2022-09-02 DIAGNOSIS — J309 Allergic rhinitis, unspecified: Secondary | ICD-10-CM

## 2022-09-14 ENCOUNTER — Ambulatory Visit (INDEPENDENT_AMBULATORY_CARE_PROVIDER_SITE_OTHER): Payer: No Typology Code available for payment source | Admitting: *Deleted

## 2022-09-14 DIAGNOSIS — J309 Allergic rhinitis, unspecified: Secondary | ICD-10-CM | POA: Diagnosis not present

## 2022-09-28 ENCOUNTER — Ambulatory Visit (INDEPENDENT_AMBULATORY_CARE_PROVIDER_SITE_OTHER): Payer: No Typology Code available for payment source | Admitting: *Deleted

## 2022-09-28 DIAGNOSIS — J309 Allergic rhinitis, unspecified: Secondary | ICD-10-CM

## 2022-10-05 ENCOUNTER — Ambulatory Visit (INDEPENDENT_AMBULATORY_CARE_PROVIDER_SITE_OTHER): Payer: No Typology Code available for payment source

## 2022-10-05 DIAGNOSIS — J309 Allergic rhinitis, unspecified: Secondary | ICD-10-CM | POA: Diagnosis not present

## 2022-10-12 ENCOUNTER — Ambulatory Visit (INDEPENDENT_AMBULATORY_CARE_PROVIDER_SITE_OTHER): Payer: No Typology Code available for payment source | Admitting: *Deleted

## 2022-10-12 DIAGNOSIS — J309 Allergic rhinitis, unspecified: Secondary | ICD-10-CM | POA: Diagnosis not present

## 2022-10-19 ENCOUNTER — Ambulatory Visit (INDEPENDENT_AMBULATORY_CARE_PROVIDER_SITE_OTHER): Payer: No Typology Code available for payment source

## 2022-10-19 DIAGNOSIS — J309 Allergic rhinitis, unspecified: Secondary | ICD-10-CM | POA: Diagnosis not present

## 2022-10-26 ENCOUNTER — Ambulatory Visit (INDEPENDENT_AMBULATORY_CARE_PROVIDER_SITE_OTHER): Payer: No Typology Code available for payment source

## 2022-10-26 DIAGNOSIS — J309 Allergic rhinitis, unspecified: Secondary | ICD-10-CM

## 2022-11-09 ENCOUNTER — Ambulatory Visit (INDEPENDENT_AMBULATORY_CARE_PROVIDER_SITE_OTHER): Payer: No Typology Code available for payment source | Admitting: *Deleted

## 2022-11-09 DIAGNOSIS — J309 Allergic rhinitis, unspecified: Secondary | ICD-10-CM | POA: Diagnosis not present

## 2022-11-24 IMAGING — CT CT RENAL STONE PROTOCOL
2 of 4 series · 16 of 46 positions shown, 18 images · non-contrast
Comparison: None.

CLINICAL DATA: Chronic back pain, mostly in the right side.



[Series 2: stone full · axial · 0.93mm/px · z∈[-537,-57]mm · 13 of 106 slices shown, 15 images]
[im 5/106  soft-tissue]
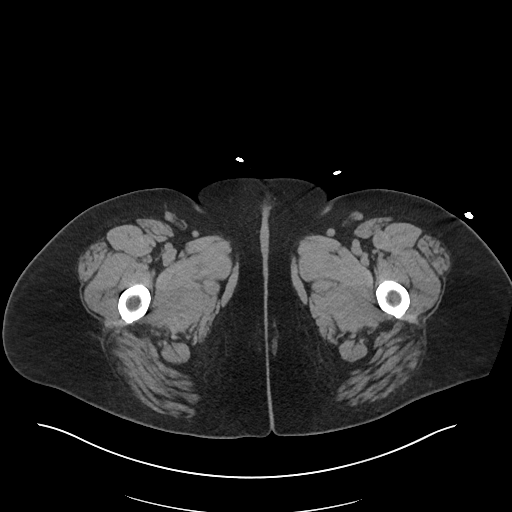
[im 5/106  bone]
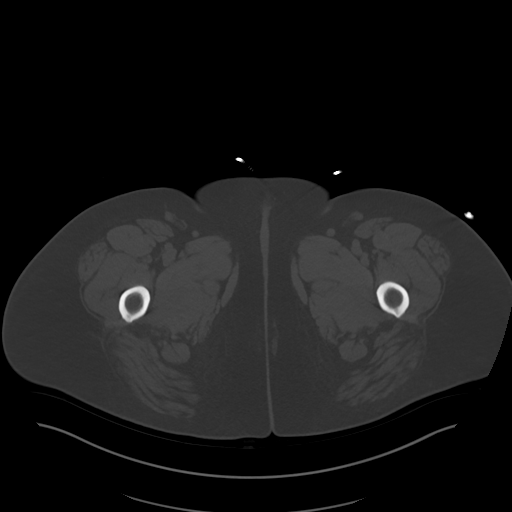
[im 13/106  soft-tissue]
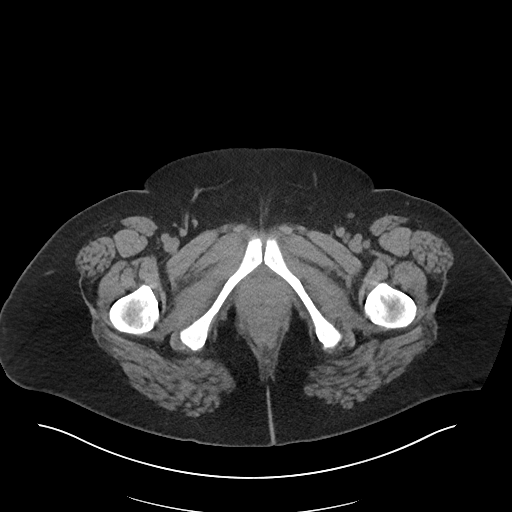
[im 22/106  soft-tissue]
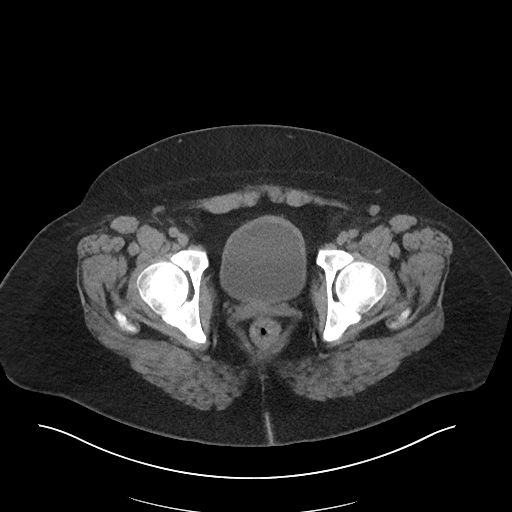
[im 30/106  soft-tissue]
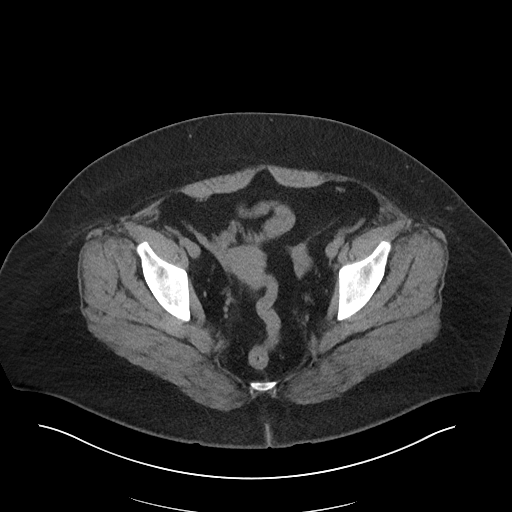
[im 38/106  soft-tissue]
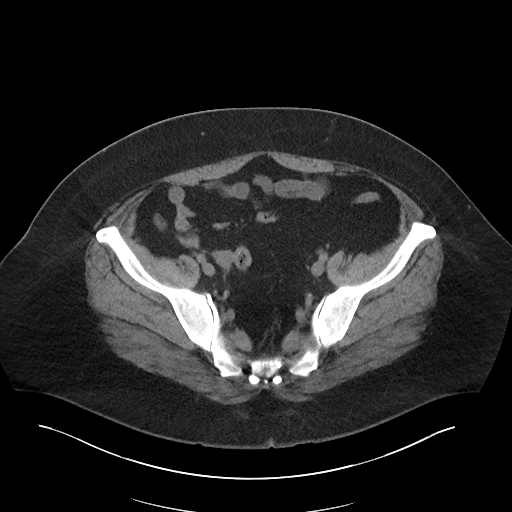
[im 47/106  soft-tissue]
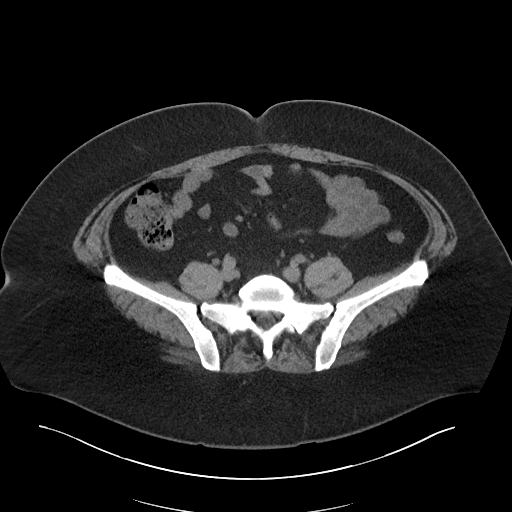
[im 55/106  soft-tissue]
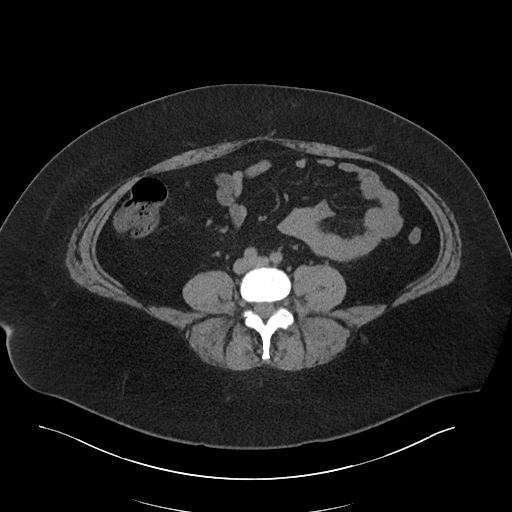
[im 59/106  soft-tissue]
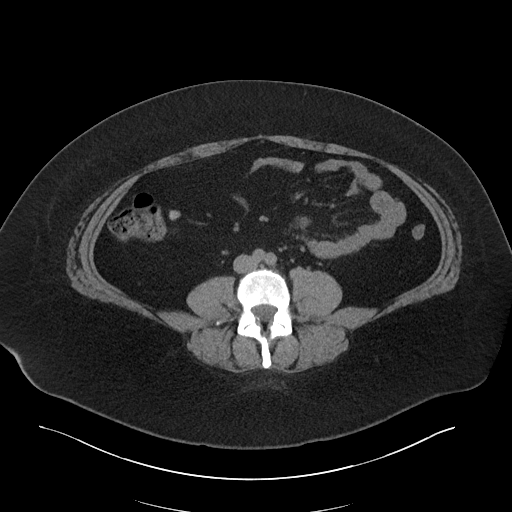
[im 68/106  soft-tissue]
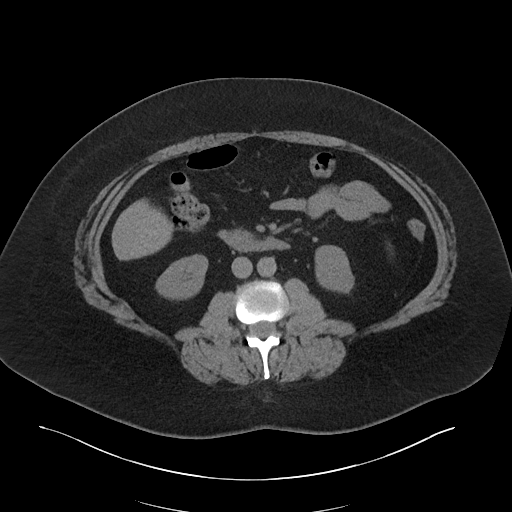
[im 68/106  bone]
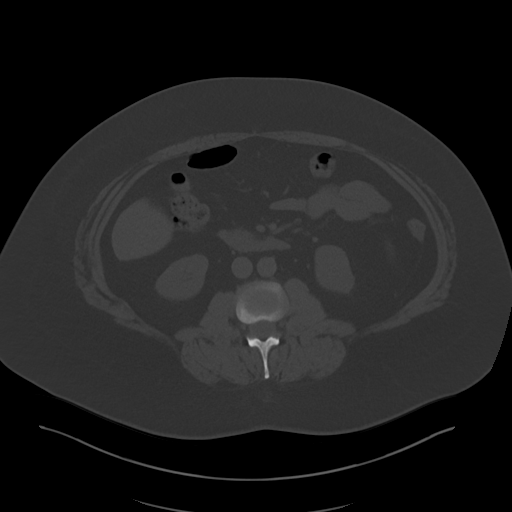
[im 76/106  soft-tissue]
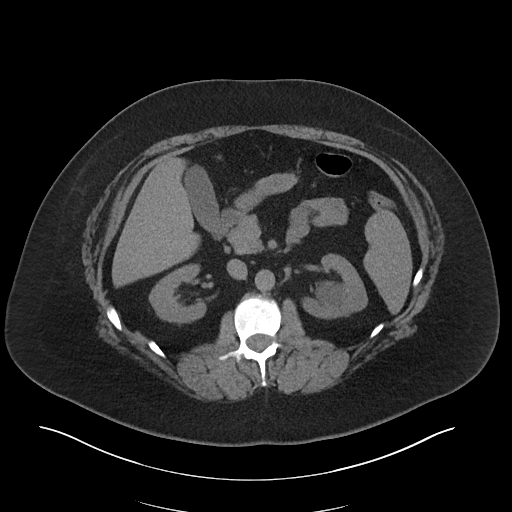
[im 85/106  soft-tissue]
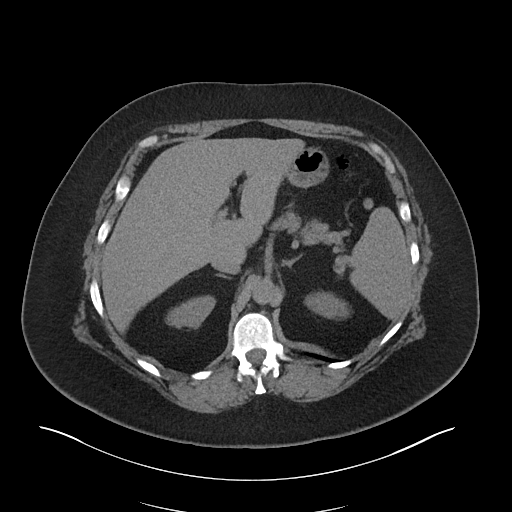
[im 93/106  soft-tissue]
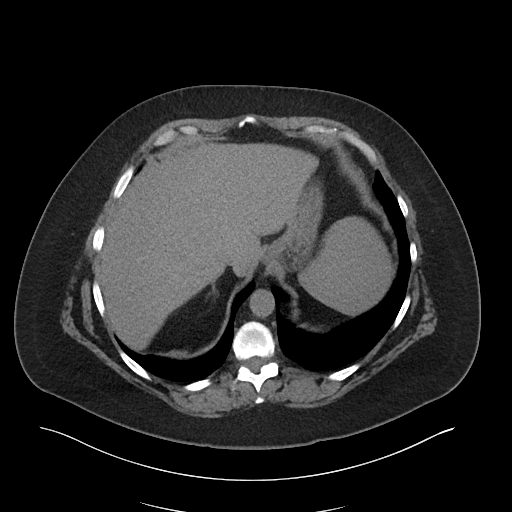
[im 101/106  soft-tissue]
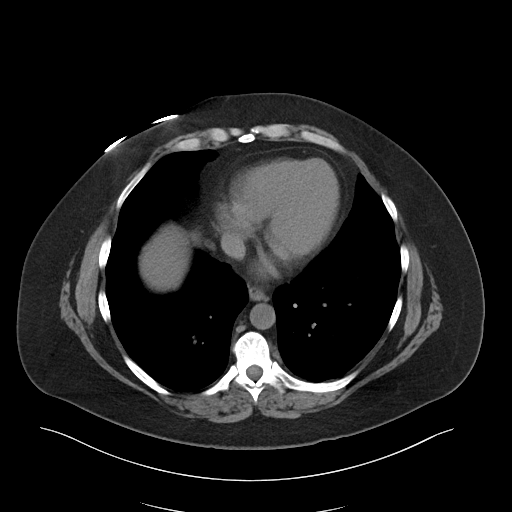

[Series 5: coronal · coronal · 0.83mm/px · 3 of 111 slices shown]
[im 37/111  soft-tissue]
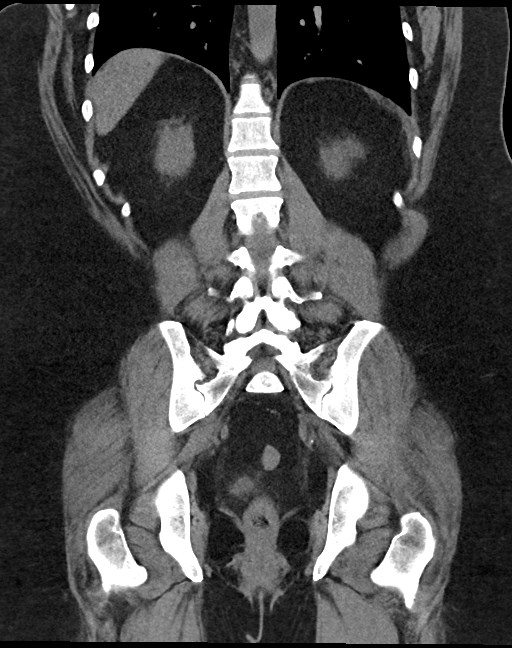
[im 49/111  soft-tissue]
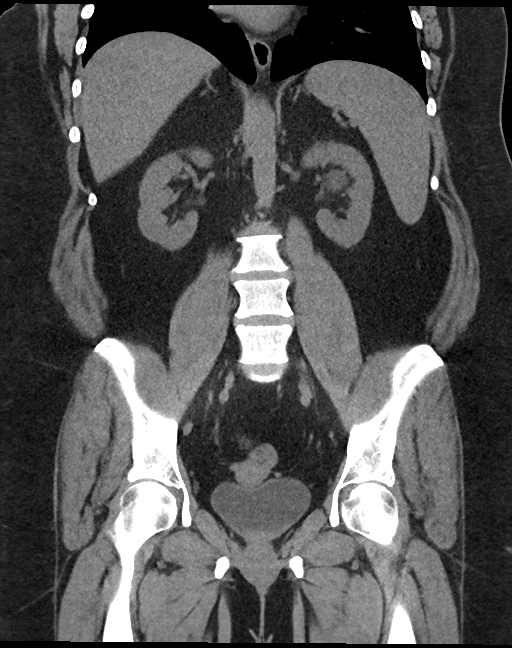
[im 62/111  soft-tissue]
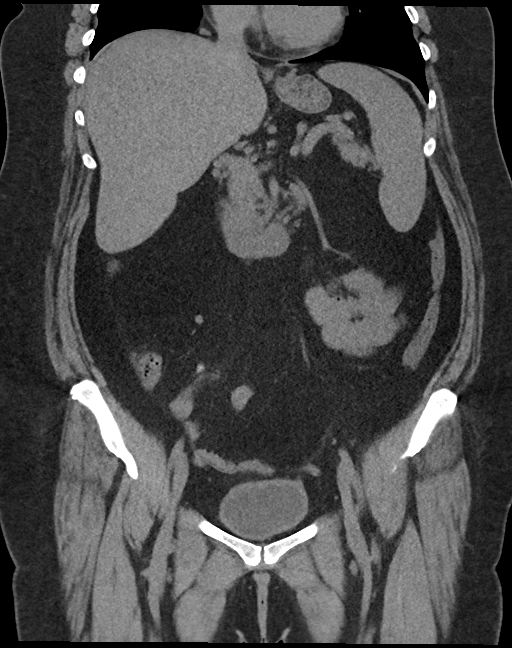

[16 of 46 positions shown; findings below may reference images not displayed]

FINDINGS: Lower chest: No acute abnormality

Hepatobiliary: No focal hepatic abnormality. Gallbladder
unremarkable.

Pancreas: No focal abnormality or ductal dilatation.

Spleen: No focal abnormality.  Normal size.

Adrenals/Urinary Tract: 3 cm left parapelvic cyst. No stones or
hydronephrosis. Adrenal glands and urinary bladder unremarkable.

Stomach/Bowel: Normal appendix. Stomach, large and small bowel
grossly unremarkable.

Vascular/Lymphatic: No evidence of aneurysm or adenopathy.

Reproductive: Uterus and adnexa unremarkable.  No mass.

Other: No free fluid or free air.

Musculoskeletal: No acute bony abnormality.
IMPRESSION: No acute findings in the abdomen or pelvis.

No renal or ureteral stones.  No hydronephrosis.

## 2022-11-25 ENCOUNTER — Ambulatory Visit (INDEPENDENT_AMBULATORY_CARE_PROVIDER_SITE_OTHER): Payer: No Typology Code available for payment source

## 2022-11-25 DIAGNOSIS — J309 Allergic rhinitis, unspecified: Secondary | ICD-10-CM | POA: Diagnosis not present

## 2022-12-07 ENCOUNTER — Ambulatory Visit (INDEPENDENT_AMBULATORY_CARE_PROVIDER_SITE_OTHER): Payer: No Typology Code available for payment source | Admitting: *Deleted

## 2022-12-07 DIAGNOSIS — J309 Allergic rhinitis, unspecified: Secondary | ICD-10-CM

## 2022-12-14 ENCOUNTER — Ambulatory Visit (INDEPENDENT_AMBULATORY_CARE_PROVIDER_SITE_OTHER): Payer: Self-pay

## 2022-12-14 DIAGNOSIS — J309 Allergic rhinitis, unspecified: Secondary | ICD-10-CM

## 2022-12-16 NOTE — Progress Notes (Signed)
VIAL EXP 12-16-23

## 2022-12-17 DIAGNOSIS — J3089 Other allergic rhinitis: Secondary | ICD-10-CM | POA: Diagnosis not present

## 2022-12-21 ENCOUNTER — Ambulatory Visit (INDEPENDENT_AMBULATORY_CARE_PROVIDER_SITE_OTHER): Payer: No Typology Code available for payment source | Admitting: *Deleted

## 2022-12-21 DIAGNOSIS — J309 Allergic rhinitis, unspecified: Secondary | ICD-10-CM | POA: Diagnosis not present

## 2022-12-22 ENCOUNTER — Encounter: Payer: Self-pay | Admitting: Allergy

## 2022-12-23 ENCOUNTER — Ambulatory Visit: Payer: No Typology Code available for payment source | Admitting: Allergy

## 2023-01-06 ENCOUNTER — Ambulatory Visit (INDEPENDENT_AMBULATORY_CARE_PROVIDER_SITE_OTHER): Payer: No Typology Code available for payment source | Admitting: *Deleted

## 2023-01-06 DIAGNOSIS — J309 Allergic rhinitis, unspecified: Secondary | ICD-10-CM

## 2023-01-13 ENCOUNTER — Encounter: Payer: Self-pay | Admitting: Allergy & Immunology

## 2023-01-18 ENCOUNTER — Encounter: Payer: Self-pay | Admitting: Allergy & Immunology

## 2023-01-18 ENCOUNTER — Other Ambulatory Visit: Payer: Self-pay

## 2023-01-18 ENCOUNTER — Ambulatory Visit (INDEPENDENT_AMBULATORY_CARE_PROVIDER_SITE_OTHER): Payer: No Typology Code available for payment source | Admitting: Allergy & Immunology

## 2023-01-18 VITALS — BP 114/72 | HR 77 | Temp 98.3°F | Resp 20 | Ht 70.0 in | Wt 243.4 lb

## 2023-01-18 DIAGNOSIS — J3089 Other allergic rhinitis: Secondary | ICD-10-CM

## 2023-01-18 DIAGNOSIS — J0141 Acute recurrent pansinusitis: Secondary | ICD-10-CM | POA: Diagnosis not present

## 2023-01-18 DIAGNOSIS — J302 Other seasonal allergic rhinitis: Secondary | ICD-10-CM | POA: Diagnosis not present

## 2023-01-18 DIAGNOSIS — R519 Headache, unspecified: Secondary | ICD-10-CM | POA: Diagnosis not present

## 2023-01-18 MED ORDER — AMOXICILLIN-POT CLAVULANATE 875-125 MG PO TABS: 1.0000 | ORAL_TABLET | Freq: Two times a day (BID) | ORAL | 0 refills | Status: AC

## 2023-01-18 NOTE — Patient Instructions (Addendum)
1. Seasonal and perennial allergic rhinitis (grasses, ragweed, weeds, trees, indoor molds, dust mites, dog and cockroach) - Continue with: Allegra (fexofenadine) 180mg  table once daily (can use TWICE DAILY on bad days) - Continue with: Ryaltris one spray per nostril twice daily  - You can use an extra dose of the antihistamine, if needed, for breakthrough symptoms.  - Continue with allergy shots at the same schedule. - We are going to increase the levels of allergens in your next set of vials.   2. Recurrent infections - You have a sinus infection (the right side actually looked WORSE than the left side). - In any case, start Augmentin twice daily for two weeks. - Take the probiotic.  - We are referring you to another ENT to see if anything surgical needs to be done.   3. Return in about 6 months (around 07/19/2023).    Please inform us of any Emergency Department visits, hospitalizations, or changes in symptoms. Call us before going to the ED for breathing or allergy symptoms since we might be able to fit you in for a sick visit. Feel free to contact us anytime with any questions, problems, or concerns.  It was a pleasure to see you again today!  Websites that have reliable patient information: 1. American Academy of Asthma, Allergy, and Immunology: www.aaaai.org 2. Food Allergy Research and Education (FARE): foodallergy.org 3. Mothers of Asthmatics: http://www.asthmacommunitynetwork.org 4. American College of Allergy, Asthma, and Immunology: www.acaai.org   COVID-19 Vaccine Information can be found at: [email or web address] For questions related to vaccine distribution or appointments, please email [email or web address] or call [phone].   We realize that you might be concerned about having an allergic reaction to the COVID19 vaccines. To help with that concern, WE ARE OFFERING THE COVID19 VACCINES IN OUR OFFICE! Ask the front  desk for dates!     "Like" [place] on Facebook and Instagram for our latest updates!      A healthy democracy works best when [employer] participate! Make sure you are registered to vote! If you have moved or changed any of your contact information, you will need to get this updated before voting!  In some cases, you MAY be able to register to vote online: [email or web address]

## 2023-01-18 NOTE — Progress Notes (Unsigned)
FOLLOW UP  Date of Service/Encounter:  01/18/23   Assessment:   Seasonal and perennial allergic rhinitis (grasses, ragweed, weeds, trees, indoor molds, dust mites, dog and cockroach) - on allergen immunotherapy with maintenance reached in March 2023    Food intolerance - now tolerating foods without a problem    Recurrent infections - with excellent response to Pneumovax, although she has had more increase in sinus symptoms as of late    Acute sinusitis - starting Augmentin    Plan/Recommendations:   1. Seasonal and perennial allergic rhinitis (grasses, ragweed, weeds, trees, indoor molds, dust mites, dog and cockroach) - Continue with: Allegra (fexofenadine) 180mg  table once daily (can use TWICE DAILY on bad days) - Continue with: Ryaltris one spray per nostril twice daily  - You can use an extra dose of the antihistamine, if needed, for breakthrough symptoms.  - Continue with allergy shots at the same schedule. - We are going to increase the levels of allergens in your next set of vials.   2. Recurrent infections - You have a sinus infection (the right side actually looked WORSE than the left side). - In any case, start Augmentin twice daily for two weeks. - Take the probiotic.  - We are referring you to another ENT to see if anything surgical needs to be done.   3. Return in about 6 months (around 07/19/2023).    Subjective:   Okema Treca Tallmadge is a 47 y.o. female presenting today for follow up of  Chief Complaint  Patient presents with   Ear Pain    2 weeks ago she experienced dizziness. She has taken ear drops for about a week with no relief.     Jesus Ajahnae Salgado has a history of the following: Patient Active Problem List   Diagnosis Date Noted   Class 2 obesity with body mass index (BMI) of 37.0 to 37.9 in adult 03/03/2018   Osteoarthritis of both knees 12/19/2017   Gastroesophageal reflux disease 12/19/2017   Allergic rhinitis 09/15/2016    Hypertension, essential, benign 09/15/2016   Depression, major, single episode, moderate (HCC) 09/15/2016   Vitamin D deficiency, unspecified 09/15/2016    History obtained from: chart review and patient.  Discussed the use of AI scribe software for clinical note transcription with the patient and/or guardian, who gave verbal consent to proceed.  Kiante is a 47 y.o. female presenting for a sick visit.  We last saw her in May 2024.  At that time, if she did a televisit for left-sided ear pain and pressure.  We sent in a second round of cefdinir.  We did order another sinus CT.  Allergy shots are going well.  Her last sinus CT was in October 2023.  She had a frontal hypodensity noted.  This was felt to be longstanding.  A follow-up head CT showed a large cystic area at the left frontal convexity.  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  Referral to neurology was discussed.    Since the last visit, she has continued to have problems.   Corneisha has a history of sinus infections and a deviated septum, presents with complaints of difficulty breathing through the left nostril, a sensation of fluid behind the ears, and dizziness. These symptoms have been ongoing for a couple of weeks. The patient also reports a fever, with a temperature of 99 degrees, which is elevated from their baseline of 97 degrees. They have been using over-the-counter ear drops for a week, but the symptoms  have not resolved.  The patient has a history of sinus infections, with the most recent one occurring over the summer. They are currently on allergy shots, which they believe have been beneficial, despite the current symptoms. The patient's worst time of the year for allergies is the fall, particularly when it rains for extended periods. This seems to really flare her symptoms quite a bit.   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 Neurology did not seem concerned, per the patient.   The patient's current symptoms are impacting their quality of life, with the difficulty breathing through the left nostril requiring them to have a fan in their face to feel comfortable. Despite these symptoms, the patient continues to function and work.     Otherwise, there have been no changes to her past medical history, surgical history, family history, or social history.    Review of systems otherwise negative other than that mentioned in the HPI.    Objective:   Blood pressure 114/72, pulse 77, temperature 98.3 F (36.8 C), temperature source Temporal, resp. rate 20, height 5\' 10"  (1.778 m), weight 243 lb 6.4 oz (110.4 kg), SpO2 96%. Body mass index is 34.92 kg/m.    Physical Exam Vitals reviewed.  Constitutional:      Appearance: She is well-developed.     Comments: Very pleasant female.  HENT:     Head: Normocephalic and atraumatic.     Right Ear: Ear canal and external ear normal. Tenderness present. A middle ear effusion is present.     Left Ear: Ear canal and external ear normal. Tenderness present. A middle ear effusion is present.     Nose: Mucosal edema and rhinorrhea present. No nasal deformity or septal deviation.     Right Turbinates: Enlarged, swollen and pale.     Left Turbinates: Enlarged, swollen and pale.     Right Sinus: Maxillary sinus tenderness present. No frontal sinus tenderness.     Left Sinus: Maxillary sinus tenderness present. No frontal sinus tenderness.     Comments: No polyps appreciated.     Mouth/Throat:     Lips: Pink.     Mouth: Mucous membranes are moist. Mucous membranes are not pale and not dry.     Pharynx: Uvula midline.     Comments: Cobblestoning present today.  Tonsils absent. Eyes:     General: Lids are normal. No allergic shiner.       Right eye: No  discharge.        Left eye: No discharge.     Conjunctiva/sclera: Conjunctivae normal.     Right eye: Right conjunctiva is not injected. No chemosis.    Left eye: Left conjunctiva is not injected. No chemosis.    Pupils: Pupils are equal, round, and reactive to light.  Cardiovascular:     Rate and Rhythm: Normal rate and regular rhythm.     Heart sounds: Normal heart sounds.  Pulmonary:     Effort: Pulmonary effort is normal. No tachypnea, accessory muscle usage or respiratory distress.     Breath sounds: Normal breath sounds. No wheezing, rhonchi or rales.     Comments: Moving air well in all lung fields.  No increased work of breathing. Chest:     Chest wall: No tenderness.  Lymphadenopathy:  Cervical: No cervical adenopathy.  Skin:    General: Skin is warm.     Capillary Refill: Capillary refill takes less than 2 seconds.     Coloration: Skin is not pale.     Findings: No abrasion, erythema, petechiae or rash. Rash is not papular, urticarial or vesicular.  Neurological:     Mental Status: She is alert.  Psychiatric:        Behavior: Behavior is cooperative.      Diagnostic studies: none        Malachi Bonds, MD  [facility]

## 2023-01-19 ENCOUNTER — Telehealth: Payer: Self-pay | Admitting: Allergy & Immunology

## 2023-01-19 NOTE — Telephone Encounter (Signed)
Patient called and stated that she had to leave work early today due to severity of sinus infection. Patient states that she is dizzy and can not make stay ay work. Patient is requesting a work excuse.

## 2023-01-19 NOTE — Telephone Encounter (Signed)
Dr. Crosby Oyster would like a Dr. Arlina Robes for leaving work early today. Please advise

## 2023-01-20 ENCOUNTER — Telehealth: Payer: Self-pay

## 2023-01-20 ENCOUNTER — Encounter: Payer: Self-pay | Admitting: Allergy & Immunology

## 2023-01-20 NOTE — Telephone Encounter (Signed)
Totally fine with that - thanks!   Donna Carey

## 2023-01-20 NOTE — Telephone Encounter (Signed)
Alfonse Spruce, MD  [facility] I called the patient to see what days she needed the note written for. She did not answer. Just FYI in case she calls back.

## 2023-01-25 ENCOUNTER — Ambulatory Visit (INDEPENDENT_AMBULATORY_CARE_PROVIDER_SITE_OTHER): Payer: No Typology Code available for payment source | Admitting: *Deleted

## 2023-01-25 DIAGNOSIS — J309 Allergic rhinitis, unspecified: Secondary | ICD-10-CM

## 2023-01-28 ENCOUNTER — Other Ambulatory Visit: Payer: Self-pay | Admitting: Medical Genetics

## 2023-01-28 DIAGNOSIS — Z006 Encounter for examination for normal comparison and control in clinical research program: Secondary | ICD-10-CM

## 2023-02-01 ENCOUNTER — Ambulatory Visit: Payer: No Typology Code available for payment source | Admitting: Allergy & Immunology

## 2023-02-01 ENCOUNTER — Ambulatory Visit (INDEPENDENT_AMBULATORY_CARE_PROVIDER_SITE_OTHER): Payer: No Typology Code available for payment source

## 2023-02-01 DIAGNOSIS — J309 Allergic rhinitis, unspecified: Secondary | ICD-10-CM | POA: Diagnosis not present

## 2023-02-01 MED ORDER — EPINEPHRINE 0.3 MG/0.3ML IJ SOAJ
0.3000 mg | INTRAMUSCULAR | 1 refills | Status: AC | PRN
Start: 2023-02-01 — End: ?

## 2023-02-02 ENCOUNTER — Ambulatory Visit (INDEPENDENT_AMBULATORY_CARE_PROVIDER_SITE_OTHER): Payer: No Typology Code available for payment source | Admitting: *Deleted

## 2023-02-02 ENCOUNTER — Encounter: Payer: Self-pay | Admitting: Allergy & Immunology

## 2023-02-02 ENCOUNTER — Telehealth: Payer: Self-pay

## 2023-02-02 DIAGNOSIS — Z538 Procedure and treatment not carried out for other reasons: Secondary | ICD-10-CM

## 2023-02-02 NOTE — Telephone Encounter (Signed)
Please disregard error

## 2023-02-04 ENCOUNTER — Encounter: Payer: Self-pay | Admitting: Allergy & Immunology

## 2023-02-04 DIAGNOSIS — J3489 Other specified disorders of nose and nasal sinuses: Secondary | ICD-10-CM

## 2023-02-04 DIAGNOSIS — H9202 Otalgia, left ear: Secondary | ICD-10-CM

## 2023-02-04 DIAGNOSIS — J0141 Acute recurrent pansinusitis: Secondary | ICD-10-CM

## 2023-02-10 ENCOUNTER — Encounter (INDEPENDENT_AMBULATORY_CARE_PROVIDER_SITE_OTHER): Payer: Self-pay | Admitting: Otolaryngology

## 2023-02-15 ENCOUNTER — Ambulatory Visit (INDEPENDENT_AMBULATORY_CARE_PROVIDER_SITE_OTHER): Payer: No Typology Code available for payment source

## 2023-02-15 DIAGNOSIS — J309 Allergic rhinitis, unspecified: Secondary | ICD-10-CM

## 2023-02-22 ENCOUNTER — Other Ambulatory Visit (HOSPITAL_COMMUNITY): Payer: Self-pay

## 2023-03-08 ENCOUNTER — Ambulatory Visit (INDEPENDENT_AMBULATORY_CARE_PROVIDER_SITE_OTHER): Payer: Self-pay | Admitting: *Deleted

## 2023-03-08 DIAGNOSIS — J309 Allergic rhinitis, unspecified: Secondary | ICD-10-CM | POA: Diagnosis not present

## 2023-03-14 ENCOUNTER — Encounter: Payer: Self-pay | Admitting: Allergy & Immunology

## 2023-03-31 ENCOUNTER — Other Ambulatory Visit (HOSPITAL_COMMUNITY)
Admission: RE | Admit: 2023-03-31 | Discharge: 2023-03-31 | Disposition: A | Discharge status: Home / Self Care | Payer: Self-pay | Source: Ambulatory Visit | Attending: Oncology | Admitting: Oncology

## 2023-03-31 DIAGNOSIS — Z006 Encounter for examination for normal comparison and control in clinical research program: Secondary | ICD-10-CM | POA: Insufficient documentation

## 2023-04-01 ENCOUNTER — Encounter (INDEPENDENT_AMBULATORY_CARE_PROVIDER_SITE_OTHER): Payer: Self-pay

## 2023-04-04 ENCOUNTER — Institutional Professional Consult (permissible substitution) (INDEPENDENT_AMBULATORY_CARE_PROVIDER_SITE_OTHER): Payer: No Typology Code available for payment source

## 2023-04-05 ENCOUNTER — Ambulatory Visit (INDEPENDENT_AMBULATORY_CARE_PROVIDER_SITE_OTHER): Payer: No Typology Code available for payment source | Admitting: *Deleted

## 2023-04-05 DIAGNOSIS — J309 Allergic rhinitis, unspecified: Secondary | ICD-10-CM

## 2023-04-06 ENCOUNTER — Telehealth (INDEPENDENT_AMBULATORY_CARE_PROVIDER_SITE_OTHER): Payer: Self-pay | Admitting: Otolaryngology

## 2023-04-06 NOTE — Telephone Encounter (Signed)
 Reminder Call:  Date: 04/07/2023 Status: Sch  Time: 8:00 AM [address] [place], [place] [address] Left Voicemail

## 2023-04-07 ENCOUNTER — Ambulatory Visit (INDEPENDENT_AMBULATORY_CARE_PROVIDER_SITE_OTHER): Payer: No Typology Code available for payment source | Admitting: Otolaryngology

## 2023-04-07 ENCOUNTER — Encounter (INDEPENDENT_AMBULATORY_CARE_PROVIDER_SITE_OTHER): Payer: Self-pay

## 2023-04-07 ENCOUNTER — Other Ambulatory Visit (INDEPENDENT_AMBULATORY_CARE_PROVIDER_SITE_OTHER): Payer: Self-pay | Admitting: Otolaryngology

## 2023-04-07 VITALS — BP 123/79 | HR 62 | Ht 70.0 in | Wt 240.0 lb

## 2023-04-07 DIAGNOSIS — J3489 Other specified disorders of nose and nasal sinuses: Secondary | ICD-10-CM | POA: Diagnosis not present

## 2023-04-07 DIAGNOSIS — H6993 Unspecified Eustachian tube disorder, bilateral: Secondary | ICD-10-CM | POA: Diagnosis not present

## 2023-04-07 DIAGNOSIS — J0181 Other acute recurrent sinusitis: Secondary | ICD-10-CM | POA: Diagnosis not present

## 2023-04-07 DIAGNOSIS — R0981 Nasal congestion: Secondary | ICD-10-CM

## 2023-04-07 DIAGNOSIS — H938X3 Other specified disorders of ear, bilateral: Secondary | ICD-10-CM | POA: Diagnosis not present

## 2023-04-07 DIAGNOSIS — R0982 Postnasal drip: Secondary | ICD-10-CM

## 2023-04-07 DIAGNOSIS — J343 Hypertrophy of nasal turbinates: Secondary | ICD-10-CM

## 2023-04-07 DIAGNOSIS — J342 Deviated nasal septum: Secondary | ICD-10-CM

## 2023-04-07 DIAGNOSIS — H608X3 Other otitis externa, bilateral: Secondary | ICD-10-CM

## 2023-04-07 MED ORDER — AYR SALINE NASAL NA GEL: 1.0000 | NASAL | 0 refills | Status: AC | PRN

## 2023-04-07 MED ORDER — BETAMETHASONE DIPROPIONATE 0.05 % EX CREA: TOPICAL_CREAM | Freq: Two times a day (BID) | CUTANEOUS | 0 refills | Status: AC

## 2023-04-07 MED ORDER — CIPROFLOXACIN-DEXAMETHASONE 0.3-0.1 % OT SUSP: 4.0000 [drp] | Freq: Two times a day (BID) | OTIC | 0 refills | Status: AC

## 2023-04-07 MED ORDER — PREDNISOLONE ACETATE 1 % OP SUSP: 1.0000 [drp] | Freq: Two times a day (BID) | OPHTHALMIC | 1 refills | Status: AC

## 2023-04-07 NOTE — Patient Instructions (Addendum)
Use ciprodex drops four drops twice daily for 14 days Use betamethasone ointment twice daily for 7 days to outside of ear Daily nasal rinses - without steroids for 2 weeks, then with steroid drops (pred forte - put 6 drops each rinse)  Lloyd Huger Med Nasal Saline Rinse  - start nasal saline rinses with NeilMed Bottle available over the counter    Nasal Saline Irrigation instructions: If you choose to make your own salt water solution, You will need: Salt (kosher, canning, or pickling salt) Baking soda Nasal irrigation bottle (i.e. Lloyd Huger Med Sinus Rinse) Measuring spoon ( teaspoon) Distilled / boiled water   Mix solution Mix 1 teaspoon of salt, 1/2 teaspoon of baking soda and 1 cup of water into irrigation bottle ** May use saline packet instead of homemade recipe for this step if you prefer If medicine was prescribed to be mixed with solution, place this into bottle Examples 2 inches of 2% mupirocin ointment Budesonide solution Position your head: Lean over sink (about 45 degrees) Rotate head (about 45 degrees) so that one nostril is above the other Irrigate Insert tip of irrigation bottle into upper nostril so it forms a comfortable seal Irrigate while breathing through your mouth May remove the straw from the bottle in order to irrigate the entire solution (important if medicine was added) Exhale through nose when finished and blow nose as necessary  Repeat on opposite side with other 1/2 of solution (120 mL) or remake solution if all 240 mL was used on first side Wash irrigation bottle regularly, replace every 3 months

## 2023-04-07 NOTE — Progress Notes (Signed)
Dear Dr. Dellis Anes, Here is my assessment for our mutual patient, [facility]. Thank you for allowing me the opportunity to care for your patient. Please do not hesitate to contact me should you have any other questions. Sincerely, Dr. Jovita Kussmaul  Otolaryngology Clinic Note  HISTORY: Donna Carey is a 48 y.o. female referred by Dr. Dellis Anes for evaluation of bilateral sinusitis and ear pressure.  Initial visit (03/2023): She reports that she gets recurrent sinus infections and ear pressure/pain. Hard time breathing out of the left side of the nose. Constant problems, but ramp up to infections, get antibiotics which improve her sx. Last few weeks she has had a sinus headache/frontal pressure almost daily. Of note, she does report she is having bilateral tension type headaches even without sinus symptoms.  Baseline sx include left nasal obstruction; ears itching; headaches off and on;  When infected, bilateral ear pain, burning, headache, sinus pain (bilateral, headache), congestion, discolored drainage from the nose. Some loss in sense of smell. "More problems with ears" right now due to fullness  She reports that she gets infections 3-4 times/year - usually gets antibiotics. No steroids. Antibiotics do help  Currently she is feeling ok, not having an active infection Allergy testing has been done. No previous sinonasal surgery. She is currently using allegra, ryaltris. With significant congestion, also takes ipratropium. On IT.  She has had CT Sinus done in 2023   Ears do itch - no eczema but mild b/l eczematous changes Patient denies: ear pain, vertigo, drainage, tinnitus Patient additionally denies: deep pain in ear canal, eustachian tube symptoms such as popping, crackling, sensitive to pressure changes Patient also denies barotrauma, vestibular suppressant use, ototoxic medication use Prior ear surgery: no  GLP-1: ozempic AP/AC: no  H&N Surgery: Tonsillectomy (Dr.  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 Dellis Anes (01/18/2023): Noted seasonal and perennial allergic rhinitis (Grasses, ragweed, weeds, trees, molds, mites, dog) on IT; recurrent infections with response to pneumovax; Acute sinusitis that time - Rx: Augmentin; Continue allegra, ryaltris, ipratropium, IT Sinus infections (Dr. Dellis Anes 12/2021, 08/10/2022): Both times, Rx: cefdinir x2.  Prior seen by Dr. 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 Pneumo 08/04/2020 and 09/25/2020: good resonse IgG, IgA, IgM and Complement 08/04/2020: normal CBC 08/04/2020: WBC 8.1, Eos 400 CT Head 02/22/2022 and CT Face 12/28/2021: independent review shows left frontal 7.4 cm hypodensity; otherwise paranasal sinuses without significant disease; left septal deviation, b/l haller cells, no significant sinonasal opacification but OMC somewhat narrow b/l; mastoids, ME and otic capsule without significant pathology b/l and aerated Past Medical History:  Diagnosis Date   Allergy    Anxiety    Depression    Hyperlipidemia    Hypertension    Keratosis pilaris    Past Surgical History:  Procedure Laterality Date   TONSILLECTOMY Bilateral 01/01/2020   Procedure: TONSILLECTOMY;  Surgeon: Drema Halon, MD;  Location: [facility];  Service: ENT;  Laterality: Bilateral;   TONSILLECTOMY     TONSILLECTOMY Bilateral 12/2019   WISDOM TOOTH EXTRACTION     Family History  Problem Relation Age of Onset   Allergic rhinitis Mother    Allergic rhinitis Father  Allergic rhinitis Sister    Migraines Sister    Mental illness Maternal Grandmother         schizophrenia   Cancer Paternal Grandmother        ovarian ,breast,lung   Breast cancer Paternal Grandmother    Sleep apnea Neg Hx    Social History   Tobacco Use   Smoking status: Never   Smokeless tobacco: Never  Substance Use Topics   Alcohol use: Yes    Comment: rarely   Allergies  Allergen Reactions   Codeine Nausea Only and Nausea And Vomiting    Other Reaction(s): nervous/neasea   Dog Epithelium (Canis Lupus Familiaris)    Fish Allergy     Eel Only   Hydrocodone-Acetaminophen Nausea And Vomiting   Molds & Smuts    Current Outpatient Medications  Medication Sig Dispense Refill   betamethasone dipropionate 0.05 % cream Apply topically 2 (two) times daily for 7 days. Apply twice daily for 7 days to outside of ear canal 30 g 0   Cholecalciferol (VITAMIN D3) 2000 units TABS Take 1 tablet by mouth daily.     ciprofloxacin-dexamethasone (CIPRODEX) OTIC suspension Place 4 drops into both ears 2 (two) times daily for 14 days. 7.5 mL 0   colestipol (COLESTID) 1 g tablet Take 1 g by mouth 2 (two) times daily.     cyclobenzaprine (FLEXERIL) 10 MG tablet Take 10 mg by mouth 3 (three) times daily as needed for muscle spasms.     DULoxetine (CYMBALTA) 60 MG capsule Take 60 mg by mouth daily.     EPINEPHrine (EPIPEN 2-PAK) 0.3 mg/0.3 mL IJ SOAJ injection Inject 0.3 mg into the muscle as needed for anaphylaxis. 2 each 1   fexofenadine (ALLEGRA ALLERGY) 180 MG tablet Take 180 mg by mouth daily.     ibuprofen (ADVIL) 200 MG tablet Take 200 mg by mouth every 6 (six) hours as needed.     ipratropium (ATROVENT) 0.06 % nasal spray Place 2 sprays into both nostrils 3 (three) times daily. 15 mL 5   losartan (COZAAR) 50 MG tablet Take 50 mg by mouth daily.     metFORMIN (GLUCOPHAGE-XR) 500 MG 24 hr tablet      omeprazole (PRILOSEC) 40 MG capsule Take 40 mg by mouth daily.     ondansetron (ZOFRAN) 4 MG tablet Take 4 mg by mouth every 8 (eight) hours as needed.     phentermine (ADIPEX-P) 37.5 MG  tablet Take 37.5 mg by mouth daily before breakfast.     prednisoLONE acetate (PRED FORTE) 1 % ophthalmic suspension Place 1 drop into both eyes in the morning and at bedtime for 14 days. Put 6 drops of steroid into the wash and then rinse half on each side 15 mL 1   rosuvastatin (CRESTOR) 5 MG tablet Take 5 mg by mouth daily.     RYALTRIS [id number] MCG/ACT SUSP Place 1 spray into the nose 2 (two) times daily. 87 g 1   saline (AYR) GEL Place 1 Application into both nostrils every 4 (four) hours as needed. 14 g 0   Semaglutide, 1 MG/DOSE, (OZEMPIC, 1 MG/DOSE,) 2 MG/1.5ML SOPN      traZODone (DESYREL) 50 MG tablet Take 50 mg by mouth at bedtime as needed for sleep.     cetirizine (EQ ALLERGY RELIEF, CETIRIZINE,) 10 MG tablet  (Patient not taking: Reported on 04/07/2023)     No current facility-administered medications for this visit.   BP 123/79 (BP Location: Left Arm, Patient Position: Sitting)  Pulse 62   Ht 5\' 10"  (1.778 m)   Wt 240 lb (108.9 kg)   SpO2 98%   BMI 34.44 kg/m   PHYSICAL EXAM:  BP 123/79 (BP Location: Left Arm, Patient Position: Sitting)   Pulse 62   Ht 5\' 10"  (1.778 m)   Wt 240 lb (108.9 kg)   SpO2 98%   BMI 34.44 kg/m    Salient findings:  CN II-XII intact  Bilateral EAC clear and TM intact with well pneumatized middle ear spaces Weber 512: mid Rinne 512: AC > BC b/l  Nose: Anterior rhinoscopy reveals left septal dev, bilateral inferior turbinate hypertrophy.  Nasal endoscopy was indicated to better evaluate the nose and paranasal sinuses, given the patient's history and exam findings, and is detailed below. No lesions of oral cavity/oropharynx; dentition fair No obviously palpable neck masses/lymphadenopathy/thyromegaly No respiratory distress or stridor   PROCEDURE: Diagnostic Nasal Endoscopy Pre-procedure diagnosis: Concern for chronic sinusitis or recurrent acute sinusitis Post-procedure diagnosis: same Indication: See pre-procedure diagnosis and physical  exam above Complications: None apparent EBL: 0 mL Anesthesia: Lidocaine 4% and topical decongestant was topically sprayed in each nasal cavity  Description of Procedure:  Patient was identified. A rigid 30 degree endoscope was utilized to evaluate the sinonasal cavities, mucosa, sinus ostia and turbinates and septum.  Overall, signs of mucosal inflammation are not noted.  No mucopurulence, polyps, or masses noted.   Nasal cavity looks quite dry bilaterally Right Middle meatus: clear Right SE Recess: clear Left MM: clear Left SE Recess: clear   Photodocumentation was obtained.  CPT CODE -- 82956 - Mod 25   ASSESSMENT:  48 y.o. F with:   1. Dysfunction of both eustachian tubes   2. Ear pressure, bilateral   3. Nasal obstruction   4. Hypertrophy of both inferior nasal turbinates   5. Nasal congestion   6. Other acute recurrent sinusitis   7. Nasal septal deviation   8. Post-nasal drip   9. Chronic eczematous otitis externa of both ears    Multiple issues today which can be divided into 3 major components it appears: Nasal obstruction likely due to septal deviation and turbinate hypertrophy for which we discussed septo/turbs since she is already maximally medically managed Does have symptoms of bilateral recurrent acute sinusitis with anatomic predisposition (haller cell, narrow OMC). Discussed that we could offer b/l max, anterior ethmoid and this may help. In addition has b/l ETD and ear pressure, could be due to allergies. We discussed that given medical management, I think what we could offer would be BTT or myringotomy as a trial. She will think about this. For itching and eczematoid changes which could be contributing, we will treat Of note, her headaches could be neurological rather than sinus driven given persistent headaches despite no sinus symptoms. She understands this  PLAN: We've discussed issues and options today.  We reviewed the nasal endoscopy images together.   The risks, benefits and alternatives were discussed and questions answered.  She has elected to proceed with:  1) Ciprodex BID x14d each ear 2) Betamethasone BID x7d to ears 3) Will do pred forte reinforced irrigations to see if additional steroid will help her symptoms. 4) Continue IT and allegra, ryaltris; ADD ayr gel  Follow-up in 1 month -- sooner as necessary. Consider b/l fess and septo/turbs with possible tubes at that point  See below regarding exact medications prescribed this encounter including dosages and route: Meds ordered this encounter  Medications   ciprofloxacin-dexamethasone (CIPRODEX) OTIC suspension  Sig: Place 4 drops into both ears 2 (two) times daily for 14 days.    Dispense:  7.5 mL    Refill:  0   betamethasone dipropionate 0.05 % cream    Sig: Apply topically 2 (two) times daily for 7 days. Apply twice daily for 7 days to outside of ear canal    Dispense:  30 g    Refill:  0   prednisoLONE acetate (PRED FORTE) 1 % ophthalmic suspension    Sig: Place 1 drop into both eyes in the morning and at bedtime for 14 days. Put 6 drops of steroid into the wash and then rinse half on each side    Dispense:  15 mL    Refill:  1   saline (AYR) GEL    Sig: Place 1 Application into both nostrils every 4 (four) hours as needed.    Dispense:  14 g    Refill:  0     Thank you for allowing me the opportunity to care for your patient. Please do not hesitate to contact me should you have any other questions.  Sincerely, Jovita Kussmaul, MD Otolarynoglogist (ENT), [facility] Phone: [phone] Fax: [phone]  MDM:  Level 4: [phone] Complexity/Problems addressed: mod - multiple problems Data complexity: high - independent interpretation of notes, labs, tests; independent review of imaging - Morbidity: mod  - Prescription Drug prescribed or managed: yes  04/08/2023, 10:38 AM

## 2023-04-11 LAB — GENECONNECT MOLECULAR SCREEN: Genetic Analysis Overall Interpretation: NEGATIVE

## 2023-05-05 ENCOUNTER — Ambulatory Visit (INDEPENDENT_AMBULATORY_CARE_PROVIDER_SITE_OTHER): Payer: Self-pay | Admitting: *Deleted

## 2023-05-05 DIAGNOSIS — J309 Allergic rhinitis, unspecified: Secondary | ICD-10-CM | POA: Diagnosis not present

## 2023-05-06 ENCOUNTER — Telehealth (INDEPENDENT_AMBULATORY_CARE_PROVIDER_SITE_OTHER): Payer: Self-pay | Admitting: Otolaryngology

## 2023-05-06 NOTE — Telephone Encounter (Signed)
REMINDER CALL: Date: 05/09/2023 Status: Sch  Time: 8:15 AM [address] [place], [place] [address]  TEXT YES-05/02/2023 12:52 PM

## 2023-05-09 ENCOUNTER — Encounter (INDEPENDENT_AMBULATORY_CARE_PROVIDER_SITE_OTHER): Payer: Self-pay

## 2023-05-09 ENCOUNTER — Ambulatory Visit (INDEPENDENT_AMBULATORY_CARE_PROVIDER_SITE_OTHER): Payer: No Typology Code available for payment source | Admitting: Otolaryngology

## 2023-05-09 VITALS — BP 116/74 | HR 83

## 2023-05-09 DIAGNOSIS — J0181 Other acute recurrent sinusitis: Secondary | ICD-10-CM

## 2023-05-09 DIAGNOSIS — R0981 Nasal congestion: Secondary | ICD-10-CM

## 2023-05-09 DIAGNOSIS — J343 Hypertrophy of nasal turbinates: Secondary | ICD-10-CM | POA: Diagnosis not present

## 2023-05-09 DIAGNOSIS — J342 Deviated nasal septum: Secondary | ICD-10-CM

## 2023-05-09 DIAGNOSIS — J3489 Other specified disorders of nose and nasal sinuses: Secondary | ICD-10-CM | POA: Diagnosis not present

## 2023-05-09 MED ORDER — AMOXICILLIN-POT CLAVULANATE 875-125 MG PO TABS: 1.0000 | ORAL_TABLET | Freq: Two times a day (BID) | ORAL | 0 refills | Status: AC

## 2023-05-09 MED ORDER — PREDNISONE 10 MG PO TABS: 10.0000 mg | ORAL_TABLET | Freq: Every day | ORAL | 0 refills | Status: DC

## 2023-05-09 NOTE — Patient Instructions (Addendum)
Take Augmentin 875 mg by mouth (PO) twice daily for 10 days - start 5 days before surgery, finish 5 days after; take with food, take probiotic or yogurt with it Take Prednisone by mouth (PO) 10mg  daily for 10 days -  start 5 days before surgery, finish 5 days after   I have ordered an imaging study for you to complete prior to your next visit. Please call [facility] Scheduling at [phone] to schedule your imaging if you have not received a call within 24 hours. If you are unable to complete your imaging study prior to your next scheduled visit please call our office to let us know.

## 2023-05-09 NOTE — Progress Notes (Signed)
Dear Dr. Scarlett Presto, Here is my assessment for our mutual patient, [facility]. Thank you for allowing me the opportunity to care for your patient. Please do not hesitate to contact me should you have any other questions. Sincerely, Dr. Jovita Kussmaul  Otolaryngology Clinic Note  HISTORY: Marshella Tello is a 48 y.o. female referred by Dr. Scarlett Presto for evaluation of bilateral sinusitis and ear pressure.  Initial visit (03/2023): She reports that she gets recurrent sinus infections and ear pressure/pain. Hard time breathing out of the left side of the nose. Constant problems, but ramp up to infections, get antibiotics which improve her sx. Last few weeks she has had a sinus headache/frontal pressure almost daily. Of note, she does report she is having bilateral tension type headaches even without sinus symptoms.  Baseline sx include left nasal obstruction; ears itching; headaches off and on;  When infected, bilateral ear pain, burning, headache, sinus pain (bilateral, headache), congestion, discolored drainage from the nose. Some loss in sense of smell. "More problems with ears" right now due to fullness  She reports that she gets infections 3-4 times/year - usually gets antibiotics. No steroids. Antibiotics do help  Currently she is feeling ok, not having an active infection Allergy testing has been done. No previous sinonasal surgery. She is currently using allegra, ryaltris. With significant congestion, also takes ipratropium. On IT.  She has had CT Sinus done in 2023   Ears do itch - no eczema but mild b/l eczematous changes Patient denies: ear pain, vertigo, drainage, tinnitus Patient additionally denies: deep pain in ear canal, eustachian tube symptoms such as popping, crackling, sensitive to pressure changes Patient also denies barotrauma, vestibular suppressant use, ototoxic medication use Prior ear surgery:  no --------------------------------------------------------- 05/09/2023 Ears itch less, has not felt them drain, ear fullness better. From ear standpoint certainly better. She reports she is having some hypoglycemia and working on that - likely contributing to her imbalance. Continues to have trouble breathing out of the nose, especially on right. "Nose is always stuffy" he wears a mouth appliance for CPAP.  She is on irrigations and doing her sprays without significant benefit.  No sinus infections since she saw me but has had at least 5 over last ~1 year or so based on her notes and chart check -- 02/2023 (Dr. Dellis Anes - recurrence of sx), 01/18/2023 (Dr. Dellis Anes), 08/10/2022 (Dr. Dellis Anes), and Jan 2024 and Oct 2023. Antibiotics do help each time. -----------------------------------------------------------  GLP-1: ozempic AP/AC: no  H&N Surgery: Tonsillectomy (Dr. 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 Dellis Anes (02/2023, and 01/18/2023): Noted seasonal and perennial allergic rhinitis (Grasses, ragweed, weeds, trees, molds, mites, dog) on IT; recurrent infections with response to pneumovax; Acute sinusitis that time - Rx: Augmentin; Continue allegra, ryaltris, ipratropium, IT Sinus infections (Dr. Dellis Anes 12/2021, 08/10/2022): Both times, Rx: cefdinir x2.  Prior seen by Dr. 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 Pneumo 08/04/2020 and 09/25/2020: good resonse IgG, IgA, IgM and Complement 08/04/2020: normal CBC 08/04/2020: WBC 8.1, Eos 400 CT Head 02/22/2022 and CT Face 12/28/2021: independent review shows left frontal 7.4 cm  hypodensity; otherwise paranasal sinuses without significant disease; left septal deviation, b/l haller cells, no significant  sinonasal opacification but OMC somewhat narrow b/l; mastoids, ME and otic capsule without significant pathology b/l and aerated Past Medical History:  Diagnosis Date   Allergy    Anxiety    Depression    Hyperlipidemia    Hypertension    Keratosis pilaris    Past Surgical History:  Procedure Laterality Date   TONSILLECTOMY Bilateral 01/01/2020   Procedure: TONSILLECTOMY;  Surgeon: Drema Halon, MD;  Location: [facility];  Service: ENT;  Laterality: Bilateral;   TONSILLECTOMY     TONSILLECTOMY Bilateral 12/2019   WISDOM TOOTH EXTRACTION     Family History  Problem Relation Age of Onset   Allergic rhinitis Mother    Allergic rhinitis Father    Allergic rhinitis Sister    Migraines Sister    Mental illness Maternal Grandmother        schizophrenia   Cancer Paternal Grandmother        ovarian ,breast,lung   Breast cancer Paternal Grandmother    Sleep apnea Neg Hx    Social History   Tobacco Use   Smoking status: Never   Smokeless tobacco: Never  Substance Use Topics   Alcohol use: Yes    Comment: rarely   Allergies  Allergen Reactions   Codeine Nausea Only and Nausea And Vomiting    Other Reaction(s): nervous/neasea   Dog Epithelium (Canis Lupus Familiaris)    Fish Allergy     Eel Only   Hydrocodone-Acetaminophen Nausea And Vomiting   Molds & Smuts    Current Outpatient Medications  Medication Sig Dispense Refill   amoxicillin-clavulanate (AUGMENTIN) 875-125 MG tablet Take 1 tablet by mouth 2 (two) times daily for 10 days. 20 tablet 0   Cholecalciferol (VITAMIN D3) 2000 units TABS Take 1 tablet by mouth daily.     DULoxetine (CYMBALTA) 60 MG capsule Take 60 mg by mouth daily.     EPINEPHrine (EPIPEN 2-PAK) 0.3 mg/0.3 mL IJ SOAJ injection Inject 0.3 mg into the muscle as needed for anaphylaxis. 2 each 1   fexofenadine  (ALLEGRA ALLERGY) 180 MG tablet Take 180 mg by mouth daily.     ibuprofen (ADVIL) 200 MG tablet Take 200 mg by mouth every 6 (six) hours as needed.     ipratropium (ATROVENT) 0.06 % nasal spray Place 2 sprays into both nostrils 3 (three) times daily. 15 mL 5   losartan (COZAAR) 50 MG tablet Take 50 mg by mouth daily.     omeprazole (PRILOSEC) 40 MG capsule Take 40 mg by mouth daily.     ondansetron (ZOFRAN) 4 MG tablet Take 4 mg by mouth every 8 (eight) hours as needed.     predniSONE (DELTASONE) 10 MG tablet Take 1 tablet (10 mg total) by mouth daily with breakfast. 10 tablet 0   rosuvastatin (CRESTOR) 5 MG tablet Take 5 mg by mouth daily.     RYALTRIS [id number] MCG/ACT SUSP Place 1 spray into the nose 2 (two) times daily. 87 g 1   saline (AYR) GEL Place 1 Application into both nostrils every 4 (four) hours as needed. 14 g 0   Semaglutide, 1 MG/DOSE, (OZEMPIC, 1 MG/DOSE,) 2 MG/1.5ML SOPN      traZODone (DESYREL) 50 MG tablet Take 50 mg by mouth at bedtime as needed for sleep.     cetirizine (EQ ALLERGY RELIEF, CETIRIZINE,) 10 MG tablet  (Patient not taking: Reported on 01/18/2023)     colestipol (COLESTID) 1 g tablet Take 1 g by mouth 2 (two) times daily. (Patient  not taking: Reported on 05/09/2023)     phentermine (ADIPEX-P) 37.5 MG tablet Take 37.5 mg by mouth daily before breakfast. (Patient not taking: Reported on 05/09/2023)     No current facility-administered medications for this visit.   BP 116/74 (BP Location: Left Arm, Patient Position: Sitting, Cuff Size: Large)   Pulse 83   SpO2 97%   PHYSICAL EXAM:  BP 116/74 (BP Location: Left Arm, Patient Position: Sitting, Cuff Size: Large)   Pulse 83   SpO2 97%    Salient findings:  CN II-XII intact Nose: Anterior rhinoscopy reveals left septal dev, bilateral inferior turbinate hypertrophy No lesions of oral cavity/oropharynx; dentition fair No obviously palpable neck masses/lymphadenopathy/thyromegaly No respiratory distress or stridor    PROCEDURE: Diagnostic Nasal Endoscopy (Prior, not today): Description of Procedure:  Patient was identified. A rigid 30 degree endoscope was utilized to evaluate the sinonasal cavities, mucosa, sinus ostia and turbinates and septum.  Overall, signs of mucosal inflammation are not noted.  No mucopurulence, polyps, or masses noted.   Nasal cavity looks quite dry bilaterally Right Middle meatus: clear Right SE Recess: clear Left MM: clear Left SE Recess: clear   Photodocumentation was obtained.   ASSESSMENT:  48 y.o. F on GLP 1 now with:  1. Other acute recurrent sinusitis   2. Nasal obstruction   3. Hypertrophy of both inferior nasal turbinates   4. Nasal congestion   5. Nasal septal deviation     Continue issues today: Nasal obstruction likely due to septal deviation and turbinate hypertrophy for which we discussed septo/turbs since she is already maximally medically managed. Persistent symptoms. We discussed the goals of septoplasty and turbinate reduction, and expectations for postoperative management. Will plan to leave splints in place, and removal was also discussed. We also discussed nasal obstruction post-operatively until splints in place and pain management. We discussed R/B/A including pain, infection, bleeding (~5% risk of operative visit for control), persistent symptoms, need for revision surgery, and other risks including damage to surrounding structures, septal perforation, and injury to skull base with risk of CSF leak and additional intracranial complications, anesthetic complications, among others.  We discussed use of nasal saline spray and nasal saline irrigations post-operatively. We also discussed use of intranasal steroid post-operatively until healing occurs. Patient understands and is ready to proceed.  2. Recurrent acute sinusitis - at least 5 exacerbation over past ~1 year and improves with abx -- as noted above. Does have symptoms of bilateral recurrent  acute sinusitis with anatomic predisposition (haller cell, narrow OMC). Offered b/l maxillary antrostomy and anterior ethmoidectomy to address, and she would like to proceed. We discussed the goals of sinus surgery, and expectations for postoperative management. We discussed R/B/A including pain, infection, bleeding (~5% risk of operative visit for control), persistent symptoms, need for revision surgery, and other risks including damage to the eye and loss of vision, and injury to skull base with risk of CSF leak and additional intracranial complications, anesthetic complications, among others.  Patient understands and is ready to proceed.  3. B/l ETD and ear pressure. For itching and eczematoid changes which could be contributing, we will treat. This has improved as a result  Of note, her headaches could be neurological rather than sinus driven given persistent headaches despite no sinus symptoms. She understands this  PLAN: We've discussed issues and options today.   The risks, benefits and alternatives were discussed and questions answered.  She has elected to proceed with:  1) Ciprodex BID x14d each ear -  now PRN for itching 2) Betamethasone BID x7d to ears - now PRN for pitching 3) Continue irrigations 4) Continue IT and allegra, ryaltris and ayr gel - Will schedule for septo/turbs, and b/l max and anterior ethmoid --- will do preop augmentin and prednisone as below - Will get pre-op CT  - Stop ozempic 1 week prior to surgery - f/u POD 5  See below regarding exact medications prescribed this encounter including dosages and route: Meds ordered this encounter  Medications   amoxicillin-clavulanate (AUGMENTIN) 875-125 MG tablet    Sig: Take 1 tablet by mouth 2 (two) times daily for 10 days.    Dispense:  20 tablet    Refill:  0   predniSONE (DELTASONE) 10 MG tablet    Sig: Take 1 tablet (10 mg total) by mouth daily with breakfast.    Dispense:  10 tablet    Refill:  0     Thank  you for allowing me the opportunity to care for your patient. Please do not hesitate to contact me should you have any other questions.  Sincerely, Jovita Kussmaul, MD Otolaryngologist (ENT), [facility] Phone: [phone] Fax: [phone]  MDM: Level 4: 99214 Complexity/Problems addressed: mod - multiple problems Data complexity: low - Morbidity: mod - decision for surgery - Prescription Drug prescribed or managed: yes  05/09/2023, 11:10 AM

## 2023-05-16 ENCOUNTER — Encounter: Payer: Self-pay | Admitting: Allergy & Immunology

## 2023-05-16 ENCOUNTER — Other Ambulatory Visit: Payer: Self-pay

## 2023-05-16 MED ORDER — RYALTRIS 665-25 MCG/ACT NA SUSP: 1.0000 | Freq: Two times a day (BID) | NASAL | 1 refills | Status: DC

## 2023-05-31 ENCOUNTER — Ambulatory Visit (INDEPENDENT_AMBULATORY_CARE_PROVIDER_SITE_OTHER): Payer: Self-pay | Admitting: *Deleted

## 2023-05-31 DIAGNOSIS — J309 Allergic rhinitis, unspecified: Secondary | ICD-10-CM | POA: Diagnosis not present

## 2023-06-13 ENCOUNTER — Ambulatory Visit (HOSPITAL_COMMUNITY)
Admission: RE | Admit: 2023-06-13 | Discharge: 2023-06-13 | Disposition: A | Discharge status: Home / Self Care | Source: Ambulatory Visit | Attending: Otolaryngology | Admitting: Otolaryngology

## 2023-06-13 DIAGNOSIS — J3489 Other specified disorders of nose and nasal sinuses: Secondary | ICD-10-CM | POA: Insufficient documentation

## 2023-06-13 DIAGNOSIS — J343 Hypertrophy of nasal turbinates: Secondary | ICD-10-CM | POA: Diagnosis present

## 2023-06-13 DIAGNOSIS — J342 Deviated nasal septum: Secondary | ICD-10-CM | POA: Insufficient documentation

## 2023-06-13 DIAGNOSIS — R0981 Nasal congestion: Secondary | ICD-10-CM | POA: Insufficient documentation

## 2023-06-13 DIAGNOSIS — J0181 Other acute recurrent sinusitis: Secondary | ICD-10-CM | POA: Insufficient documentation

## 2023-06-30 ENCOUNTER — Ambulatory Visit (INDEPENDENT_AMBULATORY_CARE_PROVIDER_SITE_OTHER): Payer: Self-pay

## 2023-06-30 DIAGNOSIS — J309 Allergic rhinitis, unspecified: Secondary | ICD-10-CM

## 2023-07-11 NOTE — Progress Notes (Signed)
 VIAL MADE 07-11-23. EXP 07-10-24

## 2023-07-14 ENCOUNTER — Telehealth (INDEPENDENT_AMBULATORY_CARE_PROVIDER_SITE_OTHER): Payer: Self-pay

## 2023-07-19 ENCOUNTER — Ambulatory Visit (INDEPENDENT_AMBULATORY_CARE_PROVIDER_SITE_OTHER): Payer: No Typology Code available for payment source | Admitting: Allergy & Immunology

## 2023-07-19 VITALS — BP 110/72 | HR 73 | Temp 98.0°F | Resp 16 | Ht 69.0 in | Wt 246.0 lb

## 2023-07-19 DIAGNOSIS — J3089 Other allergic rhinitis: Secondary | ICD-10-CM

## 2023-07-19 DIAGNOSIS — J0141 Acute recurrent pansinusitis: Secondary | ICD-10-CM | POA: Diagnosis not present

## 2023-07-19 DIAGNOSIS — J302 Other seasonal allergic rhinitis: Secondary | ICD-10-CM

## 2023-07-19 NOTE — Patient Instructions (Addendum)
 1. Seasonal and perennial allergic rhinitis (grasses, ragweed, weeds, trees, indoor molds, dust mites, dog and cockroach) - Continue with: Allegra (fexofenadine) 180mg  table once daily (can use TWICE DAILY on bad days) - Continue with: Ryaltris  one spray per nostril twice daily  - You can use an extra dose of the antihistamine, if needed, for breakthrough symptoms.  - Continue with allergy shots at the same schedule.  2. Recurrent infections - Good luck with the sinus surgery on June 4th. - I think that this in conjunction with the allergy shots should be super helpful.   3. Return in about 4 months (around 11/18/2023). You can have the follow up appointment with Dr. Idolina Maker or a Nurse Practicioner (our Nurse Practitioners are excellent and always have Physician oversight!).    Please inform us  of any Emergency Department visits, hospitalizations, or changes in symptoms. Call us  before going to the ED for breathing or allergy symptoms since we might be able to fit you in for a sick visit. Feel free to contact us  anytime with any questions, problems, or concerns.  It was a pleasure to see you again today!  Websites that have reliable patient information: 1. American Academy of Asthma, Allergy, and Immunology: www.aaaai.org 2. Food Allergy Research and Education (FARE): foodallergy.org 3. Mothers of Asthmatics: http://www.asthmacommunitynetwork.org 4. American College of Allergy, Asthma, and Immunology: www.acaai.org      "Like" us  on Facebook and Instagram for our latest updates!      A healthy democracy works best when [employer] participate! Make sure you are registered to vote! If you have moved or changed any of your contact information, you will need to get this updated before voting! Scan the QR codes below to learn more!

## 2023-07-19 NOTE — Progress Notes (Signed)
 FOLLOW UP  Date of Service/Encounter:  07/19/23   Assessment:   Seasonal and perennial allergic rhinitis (grasses, ragweed, weeds, trees, indoor molds, dust mites, dog and cockroach) - on allergen immunotherapy with maintenance reached in March 2023    Food intolerance - now tolerating foods without a problem    Recurrent infections - with excellent response to Pneumovax, although she has had more increase in sinus symptoms as of late    Plan/Recommendations:   1. Seasonal and perennial allergic rhinitis (grasses, ragweed, weeds, trees, indoor molds, dust mites, dog and cockroach) - Continue with: Allegra (fexofenadine) 180mg  table once daily (can use TWICE DAILY on bad days) - Continue with: Ryaltris  one spray per nostril twice daily  - You can use an extra dose of the antihistamine, if needed, for breakthrough symptoms.  - Continue with allergy shots at the same schedule.  2. Recurrent infections - Good luck with the sinus surgery on June 4th. - I think that this in conjunction with the allergy shots should be super helpful.   3. Return in about 4 months (around 11/18/2023). You can have the follow up appointment with Dr. Idolina Maker or a Nurse Practicioner (our Nurse Practitioners are excellent and always have Physician oversight!).   Subjective:   Donna Carey is a 48 y.o. female presenting today for follow up of  Chief Complaint  Patient presents with   Allergic Rhinitis     Donna Carey has a history of the following: Patient Active Problem List   Diagnosis Date Noted   Class 2 obesity with body mass index (BMI) of 37.0 to 37.9 in adult 03/03/2018   Osteoarthritis of both knees 12/19/2017   Gastroesophageal reflux disease 12/19/2017   Allergic rhinitis 09/15/2016   Hypertension, essential, benign 09/15/2016   Depression, major, single episode, moderate (HCC) 09/15/2016   Vitamin D  deficiency, unspecified 09/15/2016    History obtained from:  chart review and patient.  Discussed the use of AI scribe software for clinical note transcription with the patient and/or guardian, who gave verbal consent to proceed.  Donna Carey is a 48 y.o. female presenting for a follow up visit.  She was last seen in October 2024.  At that time, we continue with Allegra up to twice daily.  We also continued with Ryaltris  1 spray per nostril daily.  She was on allergen immunotherapy and we increased the amount of allergens in her allergy vials.  For her recurrent infections, we started Augmentin  twice daily for 2 weeks.  We recommended continuing with the probiotic and we referred her to ENT to see if there was anything surgical that needed to be done.  Since last visit, she has done well.  Allergic Rhinitis Symptom History: She has been experiencing chronic sinus issues characterized by bulging eardrums and pain on the side of her head. She was treated with Augmentin  a month ago. Following this, she was referred to an ENT specialist who recommended surgery to correct her deviated septum and clean out her sinuses. The surgery is scheduled for June 4th, with an anticipated recovery period of approximately five days.  She is currently on allergy shots, which she believes are effective. The shots were adjusted in March 2023 to include additional allergens. She also uses Ryaltris  nasal spray and takes Allegra twice daily due to blooming weeds and mild tree allergies. She has a history of allergies to dogs and owns two dogs, including a Frenchie, which she is allergic to. Her allergy shots have been  maintained since March 2023, and she is up to four weeks between shots.  Donna Carey is on allergen immunotherapy. She receives one injection. Immunotherapy script #1 contains  ragweed, trees, weeds, dust mites, and dog. She currently receives 0.50mL of the RED vial (1/100) every 2 weeks.  We reformulated the shots in March 2023 to make them stronger.  She works in  administration and performs diabetic eye exams.   Otherwise, there have been no changes to her past medical history, surgical history, family history, or social history.    Review of systems otherwise negative other than that mentioned in the HPI.    Objective:   Blood pressure 110/72, pulse 73, temperature 98 F (36.7 C), temperature source Oral, resp. rate 16, height 5\' 9"  (1.753 m), weight 246 lb (111.6 kg), SpO2 97%. Body mass index is 36.33 kg/m.    Physical Exam Vitals reviewed.  Constitutional:      Appearance: She is well-developed.     Comments: Very pleasant female.  HENT:     Head: Normocephalic and atraumatic.     Right Ear: Tympanic membrane, ear canal and external ear normal.     Left Ear: Tympanic membrane, ear canal and external ear normal.     Nose: No nasal deformity, septal deviation, mucosal edema or rhinorrhea.     Right Turbinates: Enlarged and swollen.     Left Turbinates: Enlarged and swollen.     Right Sinus: No maxillary sinus tenderness or frontal sinus tenderness.     Left Sinus: No maxillary sinus tenderness or frontal sinus tenderness.     Comments: No polyps appreciated.     Mouth/Throat:     Lips: Pink.     Mouth: Mucous membranes are moist. Mucous membranes are not pale and not dry.     Pharynx: Uvula midline.     Comments: Cobblestoning present today.  Tonsils absent. Eyes:     General: Lids are normal. No allergic shiner.       Right eye: No discharge.        Left eye: No discharge.     Conjunctiva/sclera: Conjunctivae normal.     Right eye: Right conjunctiva is not injected. No chemosis.    Left eye: Left conjunctiva is not injected. No chemosis.    Pupils: Pupils are equal, round, and reactive to light.  Cardiovascular:     Rate and Rhythm: Normal rate and regular rhythm.     Heart sounds: Normal heart sounds.  Pulmonary:     Effort: Pulmonary effort is normal. No tachypnea, accessory muscle usage or respiratory distress.      Breath sounds: Normal breath sounds. No wheezing, rhonchi or rales.     Comments: Moving air well in all lung fields.  No increased work of breathing. Chest:     Chest wall: No tenderness.  Lymphadenopathy:     Cervical: No cervical adenopathy.  Skin:    General: Skin is warm.     Capillary Refill: Capillary refill takes less than 2 seconds.     Coloration: Skin is not pale.     Findings: No abrasion, erythema, petechiae or rash. Rash is not papular, urticarial or vesicular.  Neurological:     Mental Status: She is alert.  Psychiatric:        Behavior: Behavior is cooperative.      Diagnostic studies: none        Drexel Gentles, MD  [facility] 

## 2023-07-20 NOTE — Telephone Encounter (Signed)
 Donna Carey

## 2023-07-21 ENCOUNTER — Encounter: Payer: Self-pay | Admitting: Allergy & Immunology

## 2023-07-26 ENCOUNTER — Ambulatory Visit (INDEPENDENT_AMBULATORY_CARE_PROVIDER_SITE_OTHER): Payer: Self-pay

## 2023-07-26 DIAGNOSIS — J309 Allergic rhinitis, unspecified: Secondary | ICD-10-CM

## 2023-08-10 ENCOUNTER — Encounter: Payer: Self-pay | Admitting: Allergy & Immunology

## 2023-08-11 ENCOUNTER — Telehealth (INDEPENDENT_AMBULATORY_CARE_PROVIDER_SITE_OTHER): Payer: Self-pay

## 2023-08-11 NOTE — Telephone Encounter (Signed)
 LVM informing pt to stop Ozempic 1 week prior to surgery.

## 2023-08-11 NOTE — Telephone Encounter (Signed)
 Patient left a message as to when to stop her medication prior to surery

## 2023-08-16 ENCOUNTER — Ambulatory Visit (INDEPENDENT_AMBULATORY_CARE_PROVIDER_SITE_OTHER): Payer: Self-pay

## 2023-08-16 DIAGNOSIS — J309 Allergic rhinitis, unspecified: Secondary | ICD-10-CM

## 2023-08-24 ENCOUNTER — Other Ambulatory Visit: Payer: Self-pay

## 2023-08-24 ENCOUNTER — Telehealth (INDEPENDENT_AMBULATORY_CARE_PROVIDER_SITE_OTHER): Payer: Self-pay | Admitting: Otolaryngology

## 2023-08-24 ENCOUNTER — Encounter (INDEPENDENT_AMBULATORY_CARE_PROVIDER_SITE_OTHER): Payer: Self-pay | Admitting: Otolaryngology

## 2023-08-24 DIAGNOSIS — J329 Chronic sinusitis, unspecified: Secondary | ICD-10-CM

## 2023-08-24 DIAGNOSIS — J343 Hypertrophy of nasal turbinates: Secondary | ICD-10-CM | POA: Diagnosis not present

## 2023-08-24 DIAGNOSIS — J3489 Other specified disorders of nose and nasal sinuses: Secondary | ICD-10-CM | POA: Diagnosis not present

## 2023-08-24 DIAGNOSIS — J342 Deviated nasal septum: Secondary | ICD-10-CM

## 2023-08-24 MED ORDER — IBUPROFEN 200 MG PO TABS: 400.0000 mg | ORAL_TABLET | Freq: Four times a day (QID) | ORAL | 0 refills | Status: AC | PRN

## 2023-08-24 MED ORDER — TRAMADOL HCL 50 MG PO TABS: 50.0000 mg | ORAL_TABLET | Freq: Four times a day (QID) | ORAL | 0 refills | Status: AC | PRN

## 2023-08-24 MED ORDER — ONDANSETRON 4 MG PO TBDP: 4.0000 mg | ORAL_TABLET | Freq: Three times a day (TID) | ORAL | 0 refills | Status: AC | PRN

## 2023-08-24 MED ORDER — ACETAMINOPHEN 500 MG PO TABS: 1000.0000 mg | ORAL_TABLET | Freq: Four times a day (QID) | ORAL | 2 refills | Status: AC | PRN

## 2023-08-24 NOTE — Telephone Encounter (Signed)
 ENT Note: Septoplasty, bilateral ITR, b/l max and anterior ethmoidectomy performed at [facility] on 08/24/2023. Will Rx tylenol , ibuprofen and tramadol and ocean spray F/u scheduled Evelina Hippo

## 2023-08-26 LAB — SURGICAL PATHOLOGY

## 2023-08-29 ENCOUNTER — Ambulatory Visit (INDEPENDENT_AMBULATORY_CARE_PROVIDER_SITE_OTHER)

## 2023-08-30 ENCOUNTER — Encounter (INDEPENDENT_AMBULATORY_CARE_PROVIDER_SITE_OTHER): Payer: Self-pay | Admitting: Otolaryngology

## 2023-08-30 ENCOUNTER — Ambulatory Visit (INDEPENDENT_AMBULATORY_CARE_PROVIDER_SITE_OTHER): Admitting: Otolaryngology

## 2023-08-30 VITALS — BP 130/79 | HR 81 | Ht 70.0 in | Wt 240.0 lb

## 2023-08-30 DIAGNOSIS — Z9889 Other specified postprocedural states: Secondary | ICD-10-CM | POA: Diagnosis not present

## 2023-08-30 DIAGNOSIS — J3489 Other specified disorders of nose and nasal sinuses: Secondary | ICD-10-CM

## 2023-08-30 DIAGNOSIS — J342 Deviated nasal septum: Secondary | ICD-10-CM

## 2023-08-30 DIAGNOSIS — J0181 Other acute recurrent sinusitis: Secondary | ICD-10-CM | POA: Diagnosis not present

## 2023-08-30 DIAGNOSIS — R0981 Nasal congestion: Secondary | ICD-10-CM

## 2023-08-30 DIAGNOSIS — J343 Hypertrophy of nasal turbinates: Secondary | ICD-10-CM

## 2023-08-30 NOTE — Progress Notes (Signed)
 Dear Dr. Ayesha Lente, Here is my assessment for our mutual patient, Donna Carey. Thank you for allowing me the opportunity to care for your patient. Please do not hesitate to contact me should you have any other questions. Sincerely, Dr. Milon Aloe  Otolaryngology Clinic Note  HISTORY: Donna Carey is a 48 y.o. female referred by Dr. Ayesha Lente for evaluation of bilateral sinusitis and ear pressure.  Initial visit (03/2023): She reports that she gets recurrent sinus infections and ear pressure/pain. Hard time breathing out of the left side of the nose. Constant problems, but ramp up to infections, get antibiotics which improve her sx. Last few weeks she has had a sinus headache/frontal pressure almost daily. Of note, she does report she is having bilateral tension type headaches even without sinus symptoms.  Baseline sx include left nasal obstruction; ears itching; headaches off and on;  When infected, bilateral ear pain, burning, headache, sinus pain (bilateral, headache), congestion, discolored drainage from the nose. Some loss in sense of smell. "More problems with ears" right now due to fullness  She reports that she gets infections 3-4 times/year - usually gets antibiotics. No steroids. Antibiotics do help  Currently she is feeling ok, not having an active infection Allergy testing has been done. No previous sinonasal surgery. She is currently using allegra, ryaltris . With significant congestion, also takes ipratropium. On IT.  She has had CT Sinus done in 2023   Ears do itch - no eczema but mild b/l eczematous changes Patient denies: ear pain, vertigo, drainage, tinnitus Patient additionally denies: deep pain in ear canal, eustachian tube symptoms such as popping, crackling, sensitive to pressure changes Patient also denies barotrauma, vestibular suppressant use, ototoxic medication use Prior ear surgery:  no --------------------------------------------------------- 05/09/2023 Ears itch less, has not felt them drain, ear fullness better. From ear standpoint certainly better. She reports she is having some hypoglycemia and working on that - likely contributing to her imbalance. Continues to have trouble breathing out of the nose, especially on right. "Nose is always stuffy" he wears a mouth appliance for CPAP.  She is on irrigations and doing her sprays without significant benefit.  No sinus infections since she saw me but has had at least 5 over last ~1 year or so based on her notes and chart check -- 02/2023 (Dr. Idolina Maker - recurrence of sx), 01/18/2023 (Dr. Idolina Maker), 08/10/2022 (Dr. Idolina Maker), and Jan 2024 and Oct 2023. Antibiotics do help each time. --------------------------------------------------------- 08/30/2023 Seen in f/u after b/l FESS and septo/turbs. Expected bleeding and discomfort. Splints removed. She has been on nasal saline.  GLP-1: ozempic AP/AC: no  H&N Surgery: Tonsillectomy (Dr. 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 Idolina Maker (02/2023, and 01/18/2023): Noted seasonal and perennial allergic rhinitis (Grasses, ragweed, weeds, trees, molds, mites, dog) on IT; recurrent infections with response to pneumovax; Acute sinusitis that time - Rx: Augmentin ; Continue allegra, ryaltris , ipratropium, IT Sinus infections (Dr. Idolina Maker 12/2021, 08/10/2022): Both times, Rx: cefdinir  x2.  Prior seen by Dr. 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 Pneumo 08/04/2020 and 09/25/2020: good resonse IgG, IgA, IgM  and Complement 08/04/2020: normal CBC 08/04/2020: WBC 8.1, Eos 400 CT Head 02/22/2022 and CT Face  12/28/2021: independent review shows left frontal 7.4 cm hypodensity; otherwise paranasal sinuses without significant disease; left septal deviation, b/l haller cells, no significant sinonasal opacification but OMC somewhat narrow b/l; mastoids, ME and otic capsule without significant pathology b/l and aerated Past Medical History:  Diagnosis Date   Allergy    Anxiety    Depression    Hyperlipidemia    Hypertension    Keratosis pilaris    Past Surgical History:  Procedure Laterality Date   TONSILLECTOMY Bilateral 01/01/2020   Procedure: TONSILLECTOMY;  Surgeon: Prescott Brodie, MD;  Location: [facility];  Service: ENT;  Laterality: Bilateral;   TONSILLECTOMY     TONSILLECTOMY Bilateral 12/2019   WISDOM TOOTH EXTRACTION     Family History  Problem Relation Age of Onset   Allergic rhinitis Mother    Allergic rhinitis Father    Allergic rhinitis Sister    Migraines Sister    Mental illness Maternal Grandmother        schizophrenia   Cancer Paternal Grandmother        ovarian ,breast,lung   Breast cancer Paternal Grandmother    Sleep apnea Neg Hx    Social History   Tobacco Use   Smoking status: Never   Smokeless tobacco: Never  Substance Use Topics   Alcohol use: Yes    Comment: rarely   Allergies  Allergen Reactions   Codeine Nausea Only and Nausea And Vomiting    Other Reaction(s): nervous/neasea   Dog Epithelium (Canis Lupus Familiaris)    Fish Allergy     Eel Only   Hydrocodone -Acetaminophen  Nausea And Vomiting   Molds & Smuts    Current Outpatient Medications  Medication Sig Dispense Refill   acetaminophen  (TYLENOL ) 500 MG tablet Take 2 tablets (1,000 mg total) by mouth every 6 (six) hours as needed for up to 7 days. 30 tablet 2   cetirizine (EQ ALLERGY RELIEF, CETIRIZINE,) 10 MG tablet      Cholecalciferol (VITAMIN D3) 2000 units TABS Take 1  tablet by mouth daily.     DULoxetine (CYMBALTA) 60 MG capsule Take 60 mg by mouth daily.     EPINEPHrine  (EPIPEN  2-PAK) 0.3 mg/0.3 mL IJ SOAJ injection Inject 0.3 mg into the muscle as needed for anaphylaxis. 2 each 1   fexofenadine (ALLEGRA ALLERGY) 180 MG tablet Take 180 mg by mouth daily.     ibuprofen  (ADVIL ) 200 MG tablet Take 2 tablets (400 mg total) by mouth every 6 (six) hours as needed. 30 tablet 0   ipratropium (ATROVENT ) 0.06 % nasal spray Place 2 sprays into both nostrils 3 (three) times daily. 15 mL 5   losartan (COZAAR) 50 MG tablet Take 50 mg by mouth daily.     omeprazole  (PRILOSEC) 40 MG capsule Take 40 mg by mouth daily.     ondansetron  (ZOFRAN ) 4 MG tablet Take 4 mg by mouth every 8 (eight) hours as needed.     ondansetron  (ZOFRAN -ODT) 4 MG disintegrating tablet Take 1 tablet (4 mg total) by mouth every 8 (eight) hours as needed for nausea or vomiting. 10 tablet 0   predniSONE  (DELTASONE ) 10 MG tablet Take 1 tablet (10 mg total) by mouth daily with breakfast. 10 tablet 0   rosuvastatin (CRESTOR) 5 MG tablet Take 5 mg by mouth daily.     RYALTRIS  098-11 MCG/ACT SUSP Place 1 spray into the nose 2 (two) times daily. 87 g 1   saline (AYR) GEL Place 1 Application into both nostrils every 4 (four)  hours as needed. 14 g 0   tirzepatide (ZEPBOUND) 10 MG/0.5ML injection vial 10mg  Subcutaneous weekly for 30 days     traZODone (DESYREL) 50 MG tablet Take 50 mg by mouth at bedtime as needed for sleep.     No current facility-administered medications for this visit.   BP 130/79 (BP Location: Left Arm, Patient Position: Sitting, Cuff Size: Large)   Pulse 81   Ht 5\' 10"  (1.778 m)   Wt 240 lb (108.9 kg)   SpO2 97%   BMI 34.44 kg/m   PHYSICAL EXAM:  BP 130/79 (BP Location: Left Arm, Patient Position: Sitting, Cuff Size: Large)   Pulse 81   Ht 5\' 10"  (1.778 m)   Wt 240 lb (108.9 kg)   SpO2 97%   BMI 34.44 kg/m    Salient findings:  CN II-XII intact Nose: Anterior rhinoscopy  reveals doyle splints in place; removed; no evidence of septal hematoma or infection; Nasal endoscopy with possible debridement was indicated to better evaluate the nose and paranasal sinuses, given the patient's history and exam findings, and is detailed below. No respiratory distress or stridor   PROCEDURE: Bilateral Diagnostic Rigid Nasal Endoscopy with Bilateral Debridement Pre-procedure diagnosis: Post-operative examination and care after bilateral Functional Endoscopic Sinus Surgery - of note: this procedure was NOT performed for management of the septoplasty or the turbinate reduction. Post-procedure diagnosis: same Indication: See pre-procedure diagnosis and physical exam above Complications: None apparent EBL: 0 mL Anesthesia: Lidocaine  4% and topical decongestant was topically sprayed in each nasal cavity  Description of Procedure:  Patient was identified. A rigid 30 degree endoscope was utilized to evaluate the sinonasal cavities, mucosa, sinus ostia and turbinates and septum. Overall, signs of mucosal inflammation are noted. Also noted are post-surgical changes with some crusting and clot within bilateral middle meati. These were debrided with a 8 Fr suction curved and straight. Small pieces of free-floating bone were removed. After debridement, sinus cavity patency was much improved. No adverse synechiae are noted. Right Middle meatus: more patent Right SE Recess: some clot, but patent Left MM: more patent Left SE Recess: some clot, but patent No evidence of septal hematoma Photodocumentation was obtained  CPT CODE -- 31237 - Mod 79, 50   ASSESSMENT:  48 y.o. F with:  1. Nasal obstruction   2. Hypertrophy of both inferior nasal turbinates   3. Nasal congestion   4. Other acute recurrent sinusitis   5. Nasal septal deviation    S/p FESS - debrided today. Septo/turbs - splints removed Of note, her headaches could be neurological rather than sinus driven given persistent  headaches despite no sinus symptoms. She understands this  - Restart irrigations - Continue IT and restart allegra, ryaltris  and ayr gel - Finish abx/steroids  - f/u 2 weeks  See below regarding exact medications prescribed this encounter including dosages and route: No orders of the defined types were placed in this encounter.    Thank you for allowing me the opportunity to care for your patient. Please do not hesitate to contact me should you have any other questions.  Sincerely, Milon Aloe, MD Otolaryngologist (ENT), [facility] Phone: [phone] Fax: [phone]  MDM:  Level 99024 CPT CODE -- 31237 - Mod 79, 50    08/30/2023, 8:05 AM

## 2023-09-02 ENCOUNTER — Telehealth (INDEPENDENT_AMBULATORY_CARE_PROVIDER_SITE_OTHER): Payer: Self-pay

## 2023-09-02 NOTE — Telephone Encounter (Signed)
 Patient clvm c/o head congestion, drainage into the throat. Wondering if this is normal. Spoke to provider and he stated that is normal and she should rince more. Attempted to contact patient but no answer. LVM informing patient of providers recommendation/advice.

## 2023-09-13 ENCOUNTER — Ambulatory Visit (INDEPENDENT_AMBULATORY_CARE_PROVIDER_SITE_OTHER): Payer: Self-pay

## 2023-09-13 DIAGNOSIS — J309 Allergic rhinitis, unspecified: Secondary | ICD-10-CM

## 2023-09-15 ENCOUNTER — Ambulatory Visit (INDEPENDENT_AMBULATORY_CARE_PROVIDER_SITE_OTHER): Admitting: Otolaryngology

## 2023-09-15 ENCOUNTER — Encounter (INDEPENDENT_AMBULATORY_CARE_PROVIDER_SITE_OTHER): Payer: Self-pay | Admitting: Otolaryngology

## 2023-09-15 VITALS — BP 124/83 | HR 78 | Ht 70.0 in | Wt 240.0 lb

## 2023-09-15 DIAGNOSIS — H608X3 Other otitis externa, bilateral: Secondary | ICD-10-CM

## 2023-09-15 DIAGNOSIS — J0181 Other acute recurrent sinusitis: Secondary | ICD-10-CM | POA: Diagnosis not present

## 2023-09-15 DIAGNOSIS — R0981 Nasal congestion: Secondary | ICD-10-CM

## 2023-09-15 DIAGNOSIS — Z9889 Other specified postprocedural states: Secondary | ICD-10-CM

## 2023-09-15 DIAGNOSIS — J342 Deviated nasal septum: Secondary | ICD-10-CM

## 2023-09-15 DIAGNOSIS — J343 Hypertrophy of nasal turbinates: Secondary | ICD-10-CM

## 2023-09-15 DIAGNOSIS — H6993 Unspecified Eustachian tube disorder, bilateral: Secondary | ICD-10-CM

## 2023-09-15 DIAGNOSIS — J3489 Other specified disorders of nose and nasal sinuses: Secondary | ICD-10-CM

## 2023-09-15 NOTE — Progress Notes (Signed)
 Dear Dr. Crecencio, Here is my assessment for our mutual patient, Donna Carey. Thank you for allowing me the opportunity to care for your patient. Please do not hesitate to contact me should you have any other questions. Sincerely, Dr. Eldora Blanch  Otolaryngology Clinic Note  HISTORY: Donna Carey is a 48 y.o. female referred by Dr. Crecencio for evaluation of bilateral sinusitis and ear pressure.  Initial visit (03/2023): She reports that she gets recurrent sinus infections and ear pressure/pain. Hard time breathing out of the left side of the nose. Constant problems, but ramp up to infections, get antibiotics which improve her sx. Last few weeks she has had a sinus headache/frontal pressure almost daily. Of note, she does report she is having bilateral tension type headaches even without sinus symptoms.  Baseline sx include left nasal obstruction; ears itching; headaches off and on;  When infected, bilateral ear pain, burning, headache, sinus pain (bilateral, headache), congestion, discolored drainage from the nose. Some loss in sense of smell. More problems with ears right now due to fullness  She reports that she gets infections 3-4 times/year - usually gets antibiotics. No steroids. Antibiotics do help  Currently she is feeling ok, not having an active infection Allergy testing has been done. No previous sinonasal surgery. She is currently using allegra, ryaltris . With significant congestion, also takes ipratropium. On IT.  She has had CT Sinus done in 2023   Ears do itch - no eczema but mild b/l eczematous changes Patient denies: ear pain, vertigo, drainage, tinnitus Patient additionally denies: deep pain in ear canal, eustachian tube symptoms such as popping, crackling, sensitive to pressure changes Patient also denies barotrauma, vestibular suppressant use, ototoxic medication use Prior ear surgery:  no --------------------------------------------------------- 05/09/2023 Ears itch less, has not felt them drain, ear fullness better. From ear standpoint certainly better. She reports she is having some hypoglycemia and working on that - likely contributing to her imbalance. Continues to have trouble breathing out of the nose, especially on right. Nose is always stuffy she wears a mouth appliance for CPAP.  She is on irrigations and doing her sprays without significant benefit.  No sinus infections since she saw me but has had at least 5 over last ~1 year or so based on her notes and chart check -- 02/2023 (Dr. Iva - recurrence of sx), 01/18/2023 (Dr. Iva), 08/10/2022 (Dr. Iva), and Jan 2024 and Oct 2023. Antibiotics do help each time. --------------------------------------------------------- 08/30/2023 Seen in f/u after b/l FESS and septo/turbs. Expected bleeding and discomfort. Splints removed. She has been on nasal saline.  --------------------------------------------------------- 09/15/2023 Seen again in follow up. Noted congestion, otherwise no bleeding. No pain. Just some congestion from the crusts and nose feels dry. Irrigating most days with navage. On ryaltris , and ayr gel.    GLP-1: ozempic AP/AC: no  H&N Surgery: Tonsillectomy (Dr. 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 Iva (02/2023, and 01/18/2023): Noted seasonal and perennial allergic rhinitis (Grasses, ragweed, weeds, trees, molds, mites, dog) on IT; recurrent infections with response to pneumovax; Acute sinusitis that time - Rx: Augmentin ; Continue allegra, ryaltris , ipratropium, IT Sinus infections (Dr. Iva 12/2021, 08/10/2022): Both times, Rx: cefdinir  x2.  Prior seen by Dr. 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 Pneumo 08/04/2020 and 09/25/2020: good resonse IgG, IgA, IgM and Complement 08/04/2020: normal CBC 08/04/2020: WBC 8.1, Eos 400 CT Head 02/22/2022 and CT Face 12/28/2021: independent review shows left frontal 7.4 cm hypodensity; otherwise paranasal sinuses without significant disease; left septal deviation, b/l haller cells, no significant sinonasal opacification but OMC somewhat narrow b/l; mastoids, ME and otic capsule without significant pathology b/l and aerated Past Medical History:  Diagnosis Date   Allergy    Anxiety    Depression    Hyperlipidemia    Hypertension    Keratosis pilaris    Past Surgical History:  Procedure Laterality Date   TONSILLECTOMY Bilateral 01/01/2020   Procedure: TONSILLECTOMY;  Surgeon: Ethyl Lonni BRAVO, MD;  Location: [facility];  Service: ENT;  Laterality: Bilateral;   TONSILLECTOMY     TONSILLECTOMY Bilateral 12/2019   WISDOM TOOTH EXTRACTION     Family History  Problem Relation Age of Onset   Allergic rhinitis Mother    Allergic rhinitis Father    Allergic rhinitis Sister    Migraines Sister    Mental illness Maternal Grandmother        schizophrenia   Cancer Paternal Grandmother        ovarian ,breast,lung   Breast cancer Paternal Grandmother    Sleep apnea Neg Hx    Social History   Tobacco Use   Smoking status: Never   Smokeless tobacco: Never  Substance Use Topics   Alcohol use: Yes    Comment: rarely   Allergies  Allergen Reactions   Codeine Nausea Only and Nausea And Vomiting    Other Reaction(s): nervous/neasea   Dog Epithelium (Canis Lupus Familiaris)    Fish Allergy     Eel Only   Hydrocodone -Acetaminophen  Nausea And Vomiting   Molds & Smuts    Current Outpatient Medications  Medication Sig  Dispense Refill   cetirizine (EQ ALLERGY RELIEF, CETIRIZINE,) 10 MG tablet      Cholecalciferol (VITAMIN D3) 2000 units TABS Take 1 tablet by mouth daily.     DULoxetine (CYMBALTA) 60 MG capsule Take 60 mg by mouth daily.     EPINEPHrine  (EPIPEN  2-PAK) 0.3 mg/0.3 mL IJ SOAJ injection Inject 0.3 mg into the muscle as needed for anaphylaxis. 2 each 1   fexofenadine (ALLEGRA ALLERGY) 180 MG tablet Take 180 mg by mouth daily.     ibuprofen  (ADVIL ) 200 MG tablet Take 2 tablets (400 mg total) by mouth every 6 (six) hours as needed. 30 tablet 0   ipratropium (ATROVENT ) 0.06 % nasal spray Place 2 sprays into both nostrils 3 (three) times daily. 15 mL 5   losartan (COZAAR) 50 MG tablet Take 50 mg by mouth daily.     omeprazole  (PRILOSEC) 40 MG capsule Take 40 mg by mouth daily.     ondansetron  (ZOFRAN ) 4 MG tablet Take 4 mg by mouth every 8 (eight) hours as needed.     ondansetron  (ZOFRAN -ODT) 4 MG disintegrating tablet Take 1 tablet (4 mg total) by mouth every 8 (eight) hours as needed for nausea or vomiting. 10 tablet 0   predniSONE  (DELTASONE ) 10 MG tablet Take 1 tablet (10 mg total) by mouth daily with breakfast. 10 tablet 0   rosuvastatin (CRESTOR) 5 MG tablet Take 5 mg by mouth daily.     RYALTRIS  665-25 MCG/ACT SUSP Place 1 spray into the nose 2 (two) times daily. 87 g 1   saline (AYR) GEL Place  1 Application into both nostrils every 4 (four) hours as needed. 14 g 0   tirzepatide (ZEPBOUND) 10 MG/0.5ML injection vial 10mg  Subcutaneous weekly for 30 days     traZODone (DESYREL) 50 MG tablet Take 50 mg by mouth at bedtime as needed for sleep.     No current facility-administered medications for this visit.   BP 124/83 (BP Location: Left Arm, Patient Position: Sitting, Cuff Size: Large)   Pulse 78   Ht 5' 10 (1.778 m)   Wt 240 lb (108.9 kg)   SpO2 97%   BMI 34.44 kg/m   PHYSICAL EXAM:  BP 124/83 (BP Location: Left Arm, Patient Position: Sitting, Cuff Size: Large)   Pulse 78   Ht 5' 10  (1.778 m)   Wt 240 lb (108.9 kg)   SpO2 97%   BMI 34.44 kg/m    Salient findings:  CN II-XII intact Nose: Anterior rhinoscopy reveals septum midline, turbinates reduced; Nasal endoscopy with possible debridement was indicated to better evaluate the nose and paranasal sinuses, given the patient's history and exam findings, and is detailed below. No respiratory distress or stridor   PROCEDURE: Bilateral Diagnostic Rigid Nasal Endoscopy with Bilateral Debridement Pre-procedure diagnosis: Post-operative examination and care after bilateral Functional Endoscopic Sinus Surgery - of note: this procedure was NOT performed for management of the septoplasty or the turbinate reduction. Post-procedure diagnosis: same Indication: See pre-procedure diagnosis and physical exam above Complications: None apparent EBL: 0 mL Anesthesia: Lidocaine  4% and topical decongestant was topically sprayed in each nasal cavity  Description of Procedure:  Patient was identified. A rigid 30 degree endoscope was utilized to evaluate the sinonasal cavities, mucosa, sinus ostia and turbinates and septum. Overall, signs of mucosal inflammation are noted. Also noted are post-surgical changes with diffuse crusting and clot within bilateral middle meati. These were debrided with a 8 Fr suction curved and straight. After debridement, sinus cavity patency was much improved. Small right MT to lateral nasal wall synechiae was disrupted. Right Middle meatus: more patent Right SE Recess: patent Left MM: more patent Left SE Recess: patent  Photodocumentation was obtained  CPT CODE -- 31237 - Mod 79, 50   ASSESSMENT:  48 y.o. F with:  1. Nasal obstruction   2. Hypertrophy of both inferior nasal turbinates   3. Nasal congestion   4. Other acute recurrent sinusitis   5. S/P FESS (functional endoscopic sinus surgery)   6. Nasal septal deviation   7. Dysfunction of both eustachian tubes   8. Chronic eczematous otitis externa  of both ears    S/p FESS - debrided again today. Recovering appropriately. Breathing improved, still with dry nasal cavities  - Continue [facility] irrigations - Continue IT and restart allegra, ryaltris  and ayr gel  - f/u 2 weeks given amount of crusting  See below regarding exact medications prescribed this encounter including dosages and route: No orders of the defined types were placed in this encounter.    Thank you for allowing me the opportunity to care for your patient. Please do not hesitate to contact me should you have any other questions.  Sincerely, Eldora Blanch, MD Otolaryngologist (ENT), [facility] Phone: [phone] Fax: [phone]  MDM:  Level 99024 CPT CODE -- 31237 - Mod 79, 50    09/15/2023, 9:15 AM

## 2023-09-20 ENCOUNTER — Ambulatory Visit (INDEPENDENT_AMBULATORY_CARE_PROVIDER_SITE_OTHER)

## 2023-09-20 DIAGNOSIS — J309 Allergic rhinitis, unspecified: Secondary | ICD-10-CM

## 2023-09-27 ENCOUNTER — Ambulatory Visit (INDEPENDENT_AMBULATORY_CARE_PROVIDER_SITE_OTHER)

## 2023-09-27 DIAGNOSIS — J309 Allergic rhinitis, unspecified: Secondary | ICD-10-CM

## 2023-09-29 ENCOUNTER — Encounter (INDEPENDENT_AMBULATORY_CARE_PROVIDER_SITE_OTHER): Payer: Self-pay | Admitting: Otolaryngology

## 2023-09-29 ENCOUNTER — Ambulatory Visit (INDEPENDENT_AMBULATORY_CARE_PROVIDER_SITE_OTHER): Admitting: Otolaryngology

## 2023-09-29 VITALS — BP 133/85 | HR 70

## 2023-09-29 DIAGNOSIS — H6993 Unspecified Eustachian tube disorder, bilateral: Secondary | ICD-10-CM

## 2023-09-29 DIAGNOSIS — R0981 Nasal congestion: Secondary | ICD-10-CM

## 2023-09-29 DIAGNOSIS — J3489 Other specified disorders of nose and nasal sinuses: Secondary | ICD-10-CM

## 2023-09-29 DIAGNOSIS — J0181 Other acute recurrent sinusitis: Secondary | ICD-10-CM

## 2023-09-29 DIAGNOSIS — H608X3 Other otitis externa, bilateral: Secondary | ICD-10-CM

## 2023-09-29 DIAGNOSIS — J343 Hypertrophy of nasal turbinates: Secondary | ICD-10-CM

## 2023-09-29 DIAGNOSIS — Z9889 Other specified postprocedural states: Secondary | ICD-10-CM

## 2023-09-29 NOTE — Progress Notes (Signed)
 Dear Dr. Crecencio, Here is my assessment for our mutual patient, Donna Carey. Thank you for allowing me the opportunity to care for your patient. Please do not hesitate to contact me should you have any other questions. Sincerely, Dr. Eldora Blanch  Otolaryngology Clinic Note  HISTORY: Donna Carey is a 48 y.o. female referred by Dr. Crecencio for evaluation of bilateral sinusitis and ear pressure.  Initial visit (03/2023): She reports that she gets recurrent sinus infections and ear pressure/pain. Hard time breathing out of the left side of the nose. Constant problems, but ramp up to infections, get antibiotics which improve her sx. Last few weeks she has had a sinus headache/frontal pressure almost daily. Of note, she does report she is having bilateral tension type headaches even without sinus symptoms.  Baseline sx include left nasal obstruction; ears itching; headaches off and on;  When infected, bilateral ear pain, burning, headache, sinus pain (bilateral, headache), congestion, discolored drainage from the nose. Some loss in sense of smell. More problems with ears right now due to fullness  She reports that she gets infections 3-4 times/year - usually gets antibiotics. No steroids. Antibiotics do help  Currently she is feeling ok, not having an active infection Allergy testing has been done. No previous sinonasal surgery. She is currently using allegra, ryaltris . With significant congestion, also takes ipratropium. On IT.  She has had CT Sinus done in 2023   Ears do itch - no eczema but mild b/l eczematous changes Patient denies: ear pain, vertigo, drainage, tinnitus Patient additionally denies: deep pain in ear canal, eustachian tube symptoms such as popping, crackling, sensitive to pressure changes Patient also denies barotrauma, vestibular suppressant use, ototoxic medication use Prior ear surgery:  no --------------------------------------------------------- 05/09/2023 Ears itch less, has not felt them drain, ear fullness better. From ear standpoint certainly better. She reports she is having some hypoglycemia and working on that - likely contributing to her imbalance. Continues to have trouble breathing out of the nose, especially on right. Nose is always stuffy she wears a mouth appliance for CPAP.  She is on irrigations and doing her sprays without significant benefit.  No sinus infections since she saw me but has had at least 5 over last ~1 year or so based on her notes and chart check -- 02/2023 (Dr. Iva - recurrence of sx), 01/18/2023 (Dr. Iva), 08/10/2022 (Dr. Iva), and Jan 2024 and Oct 2023. Antibiotics do help each time. --------------------------------------------------------- 08/30/2023 Seen in f/u after b/l FESS and septo/turbs. Expected bleeding and discomfort. Splints removed. She has been on nasal saline.  --------------------------------------------------------- 09/15/2023 Seen again in follow up. Noted congestion, otherwise no bleeding. No pain. Just some congestion from the crusts and nose feels dry. Irrigating most days with navage. On ryaltris , and ayr gel.   --------------------------------------------------------- 09/29/2023 Doing better. Congestion improving, on navage most days, still some dryness. On ryaltris  and ayr gel. No facial pressure or pain.   GLP-1: ozempic AP/AC: no  H&N Surgery: Tonsillectomy (Dr. 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 Iva (02/2023, and 01/18/2023): Noted seasonal and perennial allergic rhinitis (Grasses, ragweed, weeds, trees, molds, mites, dog) on IT; recurrent infections with response to pneumovax; Acute sinusitis that time - Rx:  Augmentin ; Continue allegra, ryaltris , ipratropium, IT Sinus infections (Dr. Iva 12/2021, 08/10/2022): Both times, Rx: cefdinir  x2.  Prior seen by Dr. 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 Pneumo 08/04/2020 and 09/25/2020: good resonse IgG, IgA, IgM and Complement 08/04/2020: normal CBC 08/04/2020: WBC 8.1, Eos 400 CT Head 02/22/2022 and CT Face 12/28/2021: independent review shows left frontal 7.4 cm hypodensity; otherwise paranasal sinuses without significant disease; left septal deviation, b/l haller cells, no significant sinonasal opacification but OMC somewhat narrow b/l; mastoids, ME and otic capsule without significant pathology b/l and aerated Past Medical History:  Diagnosis Date   Allergy    Anxiety    Depression    Hyperlipidemia    Hypertension    Keratosis pilaris    Past Surgical History:  Procedure Laterality Date   TONSILLECTOMY Bilateral 01/01/2020   Procedure: TONSILLECTOMY;  Surgeon: Ethyl Lonni BRAVO, MD;  Location: [facility];  Service: ENT;  Laterality: Bilateral;   TONSILLECTOMY     TONSILLECTOMY Bilateral 12/2019   WISDOM TOOTH EXTRACTION     Family History  Problem Relation Age of Onset   Allergic rhinitis Mother    Allergic rhinitis Father    Allergic rhinitis Sister    Migraines Sister    Mental illness Maternal Grandmother        schizophrenia   Cancer Paternal Grandmother        ovarian ,breast,lung   Breast cancer Paternal Grandmother    Sleep apnea Neg Hx    Social History   Tobacco Use   Smoking status: Never   Smokeless tobacco: Never  Substance Use Topics   Alcohol use: Yes    Comment: rarely   Allergies  Allergen Reactions   Codeine Nausea Only and Nausea And Vomiting    Other Reaction(s): nervous/neasea    Dog Epithelium (Canis Lupus Familiaris)    Fish Allergy     Eel Only   Hydrocodone -Acetaminophen  Nausea And Vomiting   Molds & Smuts    Current Outpatient Medications  Medication Sig Dispense Refill   cetirizine (EQ ALLERGY RELIEF, CETIRIZINE,) 10 MG tablet      Cholecalciferol (VITAMIN D3) 2000 units TABS Take 1 tablet by mouth daily.     DULoxetine (CYMBALTA) 60 MG capsule Take 60 mg by mouth daily.     EPINEPHrine  (EPIPEN  2-PAK) 0.3 mg/0.3 mL IJ SOAJ injection Inject 0.3 mg into the muscle as needed for anaphylaxis. 2 each 1   fexofenadine (ALLEGRA ALLERGY) 180 MG tablet Take 180 mg by mouth daily.     ibuprofen  (ADVIL ) 200 MG tablet Take 2 tablets (400 mg total) by mouth every 6 (six) hours as needed. 30 tablet 0   ipratropium (ATROVENT ) 0.06 % nasal spray Place 2 sprays into both nostrils 3 (three) times daily. 15 mL 5   losartan (COZAAR) 50 MG tablet Take 50 mg by mouth daily.     omeprazole  (PRILOSEC) 40 MG capsule Take 40 mg by mouth daily.     ondansetron  (ZOFRAN ) 4 MG tablet Take 4 mg by mouth every 8 (eight) hours as needed.     ondansetron  (ZOFRAN -ODT) 4 MG disintegrating tablet Take 1 tablet (4 mg total) by mouth every 8 (eight) hours as needed for nausea or vomiting. 10 tablet 0   predniSONE  (DELTASONE ) 10 MG tablet Take 1 tablet (10 mg total) by mouth daily with breakfast. 10 tablet 0   rosuvastatin (CRESTOR) 5 MG tablet Take 5 mg by mouth daily.  RYALTRIS  665-25 MCG/ACT SUSP Place 1 spray into the nose 2 (two) times daily. 87 g 1   saline (AYR) GEL Place 1 Application into both nostrils every 4 (four) hours as needed. 14 g 0   tirzepatide (ZEPBOUND) 10 MG/0.5ML injection vial 10mg  Subcutaneous weekly for 30 days     traZODone (DESYREL) 50 MG tablet Take 50 mg by mouth at bedtime as needed for sleep.     No current facility-administered medications for this visit.   BP 133/85   Pulse 70   SpO2 94%   PHYSICAL EXAM:  BP 133/85   Pulse 70   SpO2 94%    Salient  findings:  CN II-XII intact Nose: Anterior rhinoscopy reveals septum midline, turbinates reduced; Nasal endoscopy with possible debridement was indicated to better evaluate the nose and paranasal sinuses, given the patient's history and exam findings, and is detailed below. No respiratory distress or stridor   PROCEDURE: Bilateral Diagnostic Rigid Nasal Endoscopy with Bilateral Debridement Pre-procedure diagnosis: Post-operative examination and care after bilateral Functional Endoscopic Sinus Surgery - of note: this procedure was NOT performed for management of the septoplasty or the turbinate reduction. Post-procedure diagnosis: same Indication: See pre-procedure diagnosis and physical exam above Complications: None apparent EBL: 0 mL Anesthesia: Lidocaine  4% and topical decongestant was topically sprayed in each nasal cavity  Description of Procedure:  Patient was identified. A rigid 30 degree endoscope was utilized to evaluate the sinonasal cavities, mucosa, sinus ostia and turbinates and septum. Overall, signs of mucosal inflammation are noted but improving. Also noted are post-surgical changes with improving but persistent crusting obstructing the bilateral middle meati. These were debrided with a 8 Fr suction curved and straight. After debridement, sinus cavity patency was much improved. No adverse synechiae noted today Right Middle meatus: more patent Right SE Recess: patent Left MM: more patent Left SE Recess: patent  CPT CODE -- 31237 - Mod 79, 50   ASSESSMENT:  48 y.o. F with:  1. Nasal obstruction   2. Hypertrophy of both inferior nasal turbinates   3. Nasal congestion   4. Other acute recurrent sinusitis   5. S/P FESS (functional endoscopic sinus surgery)   6. Dysfunction of both eustachian tubes   7. Chronic eczematous otitis externa of both ears    S/p FESS - debrided again today. Recovering appropriately. Breathing improved, still with dry nasal cavities but this is  also slowly improving  - Continue NeilMed irrigations - Continue IT and restart allegra, ryaltris  and ayr gel  Eczematoid OE: improved, will observe  - f/u 4 weeks  See below regarding exact medications prescribed this encounter including dosages and route: No orders of the defined types were placed in this encounter.    Thank you for allowing me the opportunity to care for your patient. Please do not hesitate to contact me should you have any other questions.  Sincerely, Eldora Blanch, MD Otolaryngologist (ENT), [facility] Phone: [phone] Fax: [phone]  MDM:  Level 99024 CPT CODE -- 31237 - Mod 79, 50    10/02/2023, 12:46 PM

## 2023-10-12 ENCOUNTER — Encounter: Payer: Self-pay | Admitting: Allergy & Immunology

## 2023-10-25 ENCOUNTER — Ambulatory Visit: Payer: Self-pay

## 2023-10-25 DIAGNOSIS — J309 Allergic rhinitis, unspecified: Secondary | ICD-10-CM

## 2023-11-03 ENCOUNTER — Ambulatory Visit: Admitting: Allergy & Immunology

## 2023-11-03 ENCOUNTER — Ambulatory Visit (INDEPENDENT_AMBULATORY_CARE_PROVIDER_SITE_OTHER): Admitting: Otolaryngology

## 2023-11-03 VITALS — BP 122/80 | HR 63 | Temp 98.0°F | Ht 68.5 in | Wt 248.3 lb

## 2023-11-03 DIAGNOSIS — J0181 Other acute recurrent sinusitis: Secondary | ICD-10-CM

## 2023-11-03 DIAGNOSIS — J3089 Other allergic rhinitis: Secondary | ICD-10-CM | POA: Diagnosis not present

## 2023-11-03 DIAGNOSIS — J302 Other seasonal allergic rhinitis: Secondary | ICD-10-CM

## 2023-11-03 DIAGNOSIS — H6993 Unspecified Eustachian tube disorder, bilateral: Secondary | ICD-10-CM

## 2023-11-03 DIAGNOSIS — Z9889 Other specified postprocedural states: Secondary | ICD-10-CM

## 2023-11-03 DIAGNOSIS — H608X3 Other otitis externa, bilateral: Secondary | ICD-10-CM

## 2023-11-03 DIAGNOSIS — R0981 Nasal congestion: Secondary | ICD-10-CM

## 2023-11-03 DIAGNOSIS — J343 Hypertrophy of nasal turbinates: Secondary | ICD-10-CM

## 2023-11-03 DIAGNOSIS — J0141 Acute recurrent pansinusitis: Secondary | ICD-10-CM

## 2023-11-03 DIAGNOSIS — J342 Deviated nasal septum: Secondary | ICD-10-CM

## 2023-11-03 DIAGNOSIS — J3489 Other specified disorders of nose and nasal sinuses: Secondary | ICD-10-CM

## 2023-11-03 MED ORDER — RYALTRIS 665-25 MCG/ACT NA SUSP: 1.0000 | Freq: Two times a day (BID) | NASAL | 1 refills | Status: AC

## 2023-11-03 NOTE — Progress Notes (Signed)
 Dear Dr. Crecencio, Here is my assessment for our mutual patient, Donna Carey. Thank you for allowing me the opportunity to care for your patient. Please do not hesitate to contact me should you have any other questions. Sincerely, Dr. Eldora Blanch  Otolaryngology Clinic Note  HISTORY: Donna Carey is a 48 y.o. female referred by Dr. Crecencio for evaluation of bilateral sinusitis and ear pressure.  Initial visit (03/2023): She reports that she gets recurrent sinus infections and ear pressure/pain. Hard time breathing out of the left side of the nose. Constant problems, but ramp up to infections, get antibiotics which improve her sx. Last few weeks she has had a sinus headache/frontal pressure almost daily. Of note, she does report she is having bilateral tension type headaches even without sinus symptoms.  Baseline sx include left nasal obstruction; ears itching; headaches off and on;  When infected, bilateral ear pain, burning, headache, sinus pain (bilateral, headache), congestion, discolored drainage from the nose. Some loss in sense of smell. More problems with ears right now due to fullness  She reports that she gets infections 3-4 times/year - usually gets antibiotics. No steroids. Antibiotics do help  Currently she is feeling ok, not having an active infection Allergy testing has been done. No previous sinonasal surgery. She is currently using allegra, ryaltris . With significant congestion, also takes ipratropium. On IT.  She has had CT Sinus done in 2023   Ears do itch - no eczema but mild b/l eczematous changes Patient denies: ear pain, vertigo, drainage, tinnitus Patient additionally denies: deep pain in ear canal, eustachian tube symptoms such as popping, crackling, sensitive to pressure changes Patient also denies barotrauma, vestibular suppressant use, ototoxic medication use Prior ear surgery:  no --------------------------------------------------------- 05/09/2023 Ears itch less, has not felt them drain, ear fullness better. From ear standpoint certainly better. She reports she is having some hypoglycemia and working on that - likely contributing to her imbalance. Continues to have trouble breathing out of the nose, especially on right. Nose is always stuffy she wears a mouth appliance for CPAP.  She is on irrigations and doing her sprays without significant benefit.  No sinus infections since she saw me but has had at least 5 over last ~1 year or so based on her notes and chart check -- 02/2023 (Dr. Iva - recurrence of sx), 01/18/2023 (Dr. Iva), 08/10/2022 (Dr. Iva), and Jan 2024 and Oct 2023. Antibiotics do help each time. --------------------------------------------------------- 08/30/2023 Seen in f/u after b/l FESS and septo/turbs. Expected bleeding and discomfort. Splints removed. She has been on nasal saline.  --------------------------------------------------------- 09/15/2023 Seen again in follow up. Noted congestion, otherwise no bleeding. No pain. Just some congestion from the crusts and nose feels dry. Irrigating most days with navage. On ryaltris , and ayr gel.   --------------------------------------------------------- 09/29/2023 Doing better. Congestion improving, on navage most days, still some dryness. On ryaltris  and ayr gel. No facial pressure or pain. --------------------------------------------------------- 11/03/2023 Seen in follow up. Doing well breathing wise, not using navage and ryaltris  as regularly. No sinus infections since last visit.   GLP-1: ozempic AP/AC: no  H&N Surgery: Tonsillectomy (Dr. 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 Iva (02/2023, and  01/18/2023): Noted seasonal and perennial allergic rhinitis (Grasses, ragweed, weeds, trees, molds, mites, dog) on IT; recurrent infections with response to pneumovax; Acute sinusitis that time - Rx: Augmentin ; Continue  allegra, ryaltris , ipratropium, IT Sinus infections (Dr. Iva 12/2021, 08/10/2022): Both times, Rx: cefdinir  x2.  Prior seen by Dr. 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 Pneumo 08/04/2020 and 09/25/2020: good resonse IgG, IgA, IgM and Complement 08/04/2020: normal CBC 08/04/2020: WBC 8.1, Eos 400 CT Head 02/22/2022 and CT Face 12/28/2021: independent review shows left frontal 7.4 cm hypodensity; otherwise paranasal sinuses without significant disease; left septal deviation, b/l haller cells, no significant sinonasal opacification but OMC somewhat narrow b/l; mastoids, ME and otic capsule without significant pathology b/l and aerated Past Medical History:  Diagnosis Date   Allergy    Anxiety    Depression    Hyperlipidemia    Hypertension    Keratosis pilaris    Past Surgical History:  Procedure Laterality Date   TONSILLECTOMY Bilateral 01/01/2020   Procedure: TONSILLECTOMY;  Surgeon: Ethyl Lonni BRAVO, MD;  [facility];  Service: ENT;  Laterality: Bilateral;   TONSILLECTOMY     TONSILLECTOMY Bilateral 12/2019   WISDOM TOOTH EXTRACTION     Family History  Problem Relation Age of Onset   Allergic rhinitis Mother    Allergic rhinitis Father    Allergic rhinitis Sister    Migraines Sister    Mental illness Maternal Grandmother        schizophrenia   Cancer Paternal Grandmother        ovarian ,breast,lung   Breast cancer Paternal Grandmother    Sleep apnea Neg Hx    Social History   Tobacco Use   Smoking status: Never    Smokeless tobacco: Never  Substance Use Topics   Alcohol use: Yes    Comment: rarely   Allergies  Allergen Reactions   Codeine Nausea Only and Nausea And Vomiting    Other Reaction(s): nervous/neasea   Dog Epithelium (Canis Lupus Familiaris)    Fish Allergy     Eel Only   Hydrocodone -Acetaminophen  Nausea And Vomiting   Molds & Smuts    Current Outpatient Medications  Medication Sig Dispense Refill   cetirizine (EQ ALLERGY RELIEF, CETIRIZINE,) 10 MG tablet      Cholecalciferol (VITAMIN D3) 2000 units TABS Take 1 tablet by mouth daily.     DULoxetine (CYMBALTA) 60 MG capsule Take 60 mg by mouth daily.     EPINEPHrine  (EPIPEN  2-PAK) 0.3 mg/0.3 mL IJ SOAJ injection Inject 0.3 mg into the muscle as needed for anaphylaxis. 2 each 1   fexofenadine (ALLEGRA ALLERGY) 180 MG tablet Take 180 mg by mouth daily.     ibuprofen  (ADVIL ) 200 MG tablet Take 2 tablets (400 mg total) by mouth every 6 (six) hours as needed. 30 tablet 0   losartan (COZAAR) 50 MG tablet Take 50 mg by mouth daily.     omeprazole  (PRILOSEC) 40 MG capsule Take 40 mg by mouth daily.     ondansetron  (ZOFRAN ) 4 MG tablet Take 4 mg by mouth every 8 (eight) hours as needed.     ondansetron  (ZOFRAN -ODT) 4 MG disintegrating tablet Take 1 tablet (4 mg total) by mouth every 8 (eight) hours as needed for nausea or vomiting. 10 tablet 0   rosuvastatin (CRESTOR) 5 MG tablet Take 5 mg by mouth daily.     RYALTRIS  665-25 MCG/ACT SUSP Place 1 spray into the nose 2 (two) times daily. 87 g 1  saline (AYR) GEL Place 1 Application into both nostrils every 4 (four) hours as needed. 14 g 0   tirzepatide (ZEPBOUND) 10 MG/0.5ML injection vial 10mg  Subcutaneous weekly for 30 days     traZODone (DESYREL) 50 MG tablet Take 50 mg by mouth at bedtime as needed for sleep.     No current facility-administered medications for this visit.   There were no vitals taken for this visit.  PHYSICAL EXAM:  There were no vitals taken for this visit.    Salient findings:  CN II-XII intact Nose: Anterior rhinoscopy reveals septum midline, turbinates reduced; Nasal endoscopy with possible debridement was indicated to better evaluate the nose and paranasal sinuses, given the patient's history and exam findings, and is detailed below. No respiratory distress or stridor   PROCEDURE: Bilateral Diagnostic Rigid Nasal Endoscopy with Bilateral Debridement Pre-procedure diagnosis: Post-operative examination and care after bilateral Functional Endoscopic Sinus Surgery - of note: this procedure was NOT performed for management of the septoplasty or the turbinate reduction. Post-procedure diagnosis: same Indication: See pre-procedure diagnosis and physical exam above Complications: None apparent EBL: 0 mL Anesthesia: Lidocaine  4% and topical decongestant was topically sprayed in each nasal cavity  Description of Procedure:  Patient was identified. Verbal consent was obtained. A rigid 30 degree endoscope was utilized to evaluate the sinonasal cavities, mucosa, sinus ostia and turbinates and septum. Overall, signs of mucosal inflammation are noted but improving. Also noted are post-surgical changes with improving but persistent crusting right > left middle meati with large crust with slight mucoid secretions surrounding left middle meatus crust. These were debrided with a 8 Fr suction curved and straight. After debridement, sinus cavity patency was much improved. No adverse synechiae noted today though right MT somewhat lateralized. Right Middle meatus: more patent Right SE Recess: patent Left MM: more patent Left SE Recess: patent  CPT CODE -- 31237 - Mod 79, 50   ASSESSMENT:  48 y.o. F with:  1. Nasal obstruction   2. Hypertrophy of both inferior nasal turbinates   3. Nasal congestion   4. Other acute recurrent sinusitis   5. S/P FESS (functional endoscopic sinus surgery)   6. Dysfunction of both eustachian tubes   7. Chronic eczematous  otitis externa of both ears   8. Nasal septal deviation    S/p FESS - debrided again today. Recovering appropriately. Breathing improved. Would encourage more regular irrigation use given large crust in left middle meatus.  - Continue NeilMed irrigations - Continue IT and restart allegra, ryaltris  and ayr gel  Eczematoid OE: improved, will observe  F/u 6 months, sooner as needed  See below regarding exact medications prescribed this encounter including dosages and route: No orders of the defined types were placed in this encounter.    Thank you for allowing me the opportunity to care for your patient. Please do not hesitate to contact me should you have any other questions.  Sincerely, Eldora Blanch, MD Otolaryngologist (ENT), [facility] Phone: [phone] Fax: [phone]  MDM:  Level 99024 CPT CODE -- 31237 - Mod 79, 50    11/04/2023, 11:22 AM

## 2023-11-03 NOTE — Patient Instructions (Addendum)
 1. Seasonal and perennial allergic rhinitis (grasses, ragweed, weeds, trees, indoor molds, dust mites, dog and cockroach) - Continue with: alternating antihistamines as you are doing - Continue with: Ryaltris  one spray per nostril twice daily  - You can use an extra dose of the antihistamine, if needed, for breakthrough symptoms.  - Continue with allergy shots at the same schedule.  2. Recurrent infections - There is no need for a further workup.  - I think that this in conjunction with the allergy shots should be super helpful.   3. Return in about 6 months (around 05/05/2024). You can have the follow up appointment with Dr. Iva or a Nurse Practicioner (our Nurse Practitioners are excellent and always have Physician oversight!).    Please inform us  of any Emergency Department visits, hospitalizations, or changes in symptoms. Call us  before going to the ED for breathing or allergy symptoms since we might be able to fit you in for a sick visit. Feel free to contact us  anytime with any questions, problems, or concerns.  It was a pleasure to see you again today!  Websites that have reliable patient information: 1. American Academy of Asthma, Allergy, and Immunology: www.aaaai.org 2. Food Allergy Research and Education (FARE): foodallergy.org 3. Mothers of Asthmatics: http://www.asthmacommunitynetwork.org 4. American College of Allergy, Asthma, and Immunology: www.acaai.org      "Like" us  on Facebook and Instagram for our latest updates!      A healthy democracy works best when [employer] participate! Make sure you are registered to vote! If you have moved or changed any of your contact information, you will need to get this updated before voting! Scan the QR codes below to learn more!

## 2023-11-03 NOTE — Progress Notes (Unsigned)
 FOLLOW UP  Date of Service/Encounter:  11/03/23   Assessment:   Seasonal and perennial allergic rhinitis (grasses, ragweed, weeds, trees, indoor molds, dust mites, dog and cockroach) - on allergen immunotherapy with maintenance reached in March 2023    Food intolerance - now tolerating foods without a problem    Recurrent infections - with excellent response to Pneumovax, although she has had more increase in sinus symptoms as of late   Plan/Recommendations:   There are no Patient Instructions on file for this visit.   Subjective:   Assia Azia Toutant is a 48 y.o. female presenting today for follow up of  Chief Complaint  Patient presents with   Follow-up    Allergies  No concerns    Danyale Donnalee Cellucci has a history of the following: Patient Active Problem List   Diagnosis Date Noted   Class 2 obesity with body mass index (BMI) of 37.0 to 37.9 in adult 03/03/2018   Osteoarthritis of both knees 12/19/2017   Gastroesophageal reflux disease 12/19/2017   Allergic rhinitis 09/15/2016   Hypertension, essential, benign 09/15/2016   Depression, major, single episode, moderate (HCC) 09/15/2016   Vitamin D  deficiency, unspecified 09/15/2016    History obtained from: chart review and {Persons; PED relatives w/patient:19415::patient}.  Discussed the use of AI scribe software for clinical note transcription with the patient and/or guardian, who gave verbal consent to proceed.  Monifah is a 48 y.o. female presenting for {Blank single:19197::a food challenge,a drug challenge,skin testing,a sick visit,an evaluation of ***,a follow up visit}.  She was last seen in April 2025.  At that time, we continue with Allegra twice daily as well as Ryaltris .  For her recurrent infections, she was scheduled for sinus surgery in June.  Since last visit,  Asthma/Respiratory Symptom History: ***  Allergic Rhinitis Symptom History: ***  Ramah is on allergen  immunotherapy. She receives one injection. Immunotherapy script #1 contains  ragweed, trees, weeds, dust mites, and dog. She currently receives 0.50mL of the RED vial (1/100) every 4 weeks.  We reformulated the shots in March 2023 to make them stronger.    Food Allergy Symptom History: ***  Skin Symptom History: ***  GERD Symptom History: ***  Infection Symptom History: ***  Otherwise, there have been no changes to her past medical history, surgical history, family history, or social history.    Review of systems otherwise negative other than that mentioned in the HPI.    Objective:   There were no vitals taken for this visit. There is no height or weight on file to calculate BMI.    Physical Exam   Diagnostic studies: {Blank single:19197::none,deferred due to recent antihistamine use,deferred due to insurance stipulations that require a separate visit for testing,labs sent instead, }  Spirometry: {Blank single:19197::results normal (FEV1: ***%, FVC: ***%, FEV1/FVC: ***%),results abnormal (FEV1: ***%, FVC: ***%, FEV1/FVC: ***%)}.    {Blank single:19197::Spirometry consistent with mild obstructive disease,Spirometry consistent with moderate obstructive disease,Spirometry consistent with severe obstructive disease,Spirometry consistent with possible restrictive disease,Spirometry consistent with mixed obstructive and restrictive disease,Spirometry uninterpretable due to technique,Spirometry consistent with normal pattern}. {Blank single:19197::Albuterol/Atrovent  nebulizer,Xopenex/Atrovent  nebulizer,Albuterol nebulizer,Albuterol four puffs via MDI,Xopenex four puffs via MDI} treatment given in clinic with {Blank single:19197::significant improvement in FEV1 per ATS criteria,significant improvement in FVC per ATS criteria,significant improvement in FEV1 and FVC per ATS criteria,improvement in FEV1, but not significant per ATS  criteria,improvement in FVC, but not significant per ATS criteria,improvement in FEV1 and FVC, but not significant per ATS criteria,no improvement}.  Allergy Studies: {Blank single:19197::none,deferred due to recent antihistamine use,deferred due to insurance stipulations that require a separate visit for testing,labs sent instead, }    {Blank single:19197::Allergy testing results were read and interpreted by myself, documented by clinical staff., }      Marty Shaggy, MD  [facility] 

## 2023-11-04 ENCOUNTER — Encounter (INDEPENDENT_AMBULATORY_CARE_PROVIDER_SITE_OTHER): Payer: Self-pay | Admitting: Otolaryngology

## 2023-11-08 ENCOUNTER — Encounter: Payer: Self-pay | Admitting: Allergy & Immunology

## 2023-11-22 ENCOUNTER — Other Ambulatory Visit (HOSPITAL_BASED_OUTPATIENT_CLINIC_OR_DEPARTMENT_OTHER): Payer: Self-pay

## 2023-11-22 ENCOUNTER — Other Ambulatory Visit: Payer: Self-pay

## 2023-11-22 ENCOUNTER — Ambulatory Visit (INDEPENDENT_AMBULATORY_CARE_PROVIDER_SITE_OTHER)

## 2023-11-22 DIAGNOSIS — J309 Allergic rhinitis, unspecified: Secondary | ICD-10-CM

## 2023-11-22 MED ORDER — TRAZODONE HCL 50 MG PO TABS
50.0000 mg | ORAL_TABLET | Freq: Every evening | ORAL | 1 refills | Status: AC
  Filled 2023-11-22: qty 180, 90d supply, fill #0
  Filled 2024-04-04: qty 180, 90d supply, fill #1
  Filled 2024-04-09: qty 180, 90d supply, fill #0

## 2023-11-23 ENCOUNTER — Other Ambulatory Visit (HOSPITAL_BASED_OUTPATIENT_CLINIC_OR_DEPARTMENT_OTHER): Payer: Self-pay

## 2023-11-23 ENCOUNTER — Encounter (HOSPITAL_BASED_OUTPATIENT_CLINIC_OR_DEPARTMENT_OTHER): Payer: Self-pay

## 2023-11-23 DIAGNOSIS — G4733 Obstructive sleep apnea (adult) (pediatric): Secondary | ICD-10-CM | POA: Diagnosis not present

## 2023-11-23 DIAGNOSIS — Z6836 Body mass index (BMI) 36.0-36.9, adult: Secondary | ICD-10-CM | POA: Diagnosis not present

## 2023-11-23 DIAGNOSIS — E785 Hyperlipidemia, unspecified: Secondary | ICD-10-CM | POA: Diagnosis not present

## 2023-11-23 DIAGNOSIS — G4700 Insomnia, unspecified: Secondary | ICD-10-CM | POA: Diagnosis not present

## 2023-11-23 DIAGNOSIS — E66812 Obesity, class 2: Secondary | ICD-10-CM | POA: Diagnosis not present

## 2023-11-24 ENCOUNTER — Other Ambulatory Visit (HOSPITAL_BASED_OUTPATIENT_CLINIC_OR_DEPARTMENT_OTHER): Payer: Self-pay

## 2023-11-24 ENCOUNTER — Telehealth: Admitting: Physician Assistant

## 2023-11-24 DIAGNOSIS — J069 Acute upper respiratory infection, unspecified: Secondary | ICD-10-CM

## 2023-11-24 MED ORDER — PROMETHAZINE-DM 6.25-15 MG/5ML PO SYRP
5.0000 mL | ORAL_SOLUTION | Freq: Four times a day (QID) | ORAL | 0 refills | Status: AC | PRN
  Filled 2023-11-24: qty 118, 6d supply, fill #0

## 2023-11-24 NOTE — Progress Notes (Signed)

## 2023-11-24 NOTE — Progress Notes (Signed)
 I have spent 5 minutes in review of e-visit questionnaire, review and updating patient chart, medical decision making and response to patient.   Elsie Velma Lunger, PA-C

## 2023-12-19 NOTE — Telephone Encounter (Signed)
 Donna Carey phoned in today and stated that since surgery, everything taste bad and salty. Will this go away?

## 2023-12-19 NOTE — Telephone Encounter (Signed)
 Shoshanna's husband called in and stated that Donna Carey has started to bleed and she is now throwing up. What do they need to do? I will call Dr. Ethyl since he is not in office at this time.

## 2023-12-20 ENCOUNTER — Other Ambulatory Visit: Payer: Self-pay

## 2023-12-20 ENCOUNTER — Other Ambulatory Visit (HOSPITAL_BASED_OUTPATIENT_CLINIC_OR_DEPARTMENT_OTHER): Payer: Self-pay

## 2023-12-20 ENCOUNTER — Ambulatory Visit
Admission: EM | Admit: 2023-12-20 | Discharge: 2023-12-20 | Disposition: A | Discharge status: Home / Self Care | Attending: Family Medicine | Admitting: Family Medicine

## 2023-12-20 DIAGNOSIS — B349 Viral infection, unspecified: Secondary | ICD-10-CM

## 2023-12-20 DIAGNOSIS — R0981 Nasal congestion: Secondary | ICD-10-CM

## 2023-12-20 DIAGNOSIS — J309 Allergic rhinitis, unspecified: Secondary | ICD-10-CM

## 2023-12-20 LAB — POC SOFIA SARS ANTIGEN FIA: SARS Coronavirus 2 Ag: NEGATIVE

## 2023-12-20 MED ORDER — PREDNISONE 20 MG PO TABS
40.0000 mg | ORAL_TABLET | Freq: Every day | ORAL | 0 refills | Status: AC
  Filled 2023-12-20: qty 10, 5d supply, fill #0

## 2023-12-20 NOTE — Discharge Instructions (Signed)
 You tested negative for COVID.  We start prednisone  daily for 5 days.  Continue your allergy medicine and nasal sprays/rinses as needed.  Follow-up with your PCP if your symptoms do not improve.  Please go to the ER for any worsening symptoms.  I hope you feel better soon!

## 2023-12-20 NOTE — ED Provider Notes (Signed)
 [facility]    CSN: [id number] Arrival date & time: 12/20/23  1013      History   Chief Complaint Chief Complaint  Patient presents with   Cough   Headache   Facial Pain    HPI Donna Carey is a 48 y.o. female  presents for evaluation of URI symptoms for 4 days. Patient reports associated symptoms of cough, congestion, sneezing, headache, sinus pressure/pain. Denies N/V/D, fever, ear pain, body aches, shortness of breath. Patient does not have a hx of asthma. Patient is not an active smoker.   Reports exposure to COVID via work..  Pt has taken her normal allergy medicine OTC for symptoms.  Does have a history of allergic rhinitis.  Pt has no other concerns at this time.    Cough Associated symptoms: headaches   Headache Associated symptoms: congestion, cough and sinus pressure     Past Medical History:  Diagnosis Date   Allergy    Anxiety    Depression    Hyperlipidemia    Hypertension    Keratosis pilaris     Patient Active Problem List   Diagnosis Date Noted   Class 2 obesity with body mass index (BMI) of 37.0 to 37.9 in adult 03/03/2018   Osteoarthritis of both knees 12/19/2017   Gastroesophageal reflux disease 12/19/2017   Allergic rhinitis 09/15/2016   Hypertension, essential, benign 09/15/2016   Depression, major, single episode, moderate (HCC) 09/15/2016   Vitamin D  deficiency, unspecified 09/15/2016    Past Surgical History:  Procedure Laterality Date   TONSILLECTOMY Bilateral 01/01/2020   Procedure: TONSILLECTOMY;  Surgeon: Ethyl Lonni BRAVO, MD;  Location: [facility];  Service: ENT;  Laterality: Bilateral;   TONSILLECTOMY     TONSILLECTOMY Bilateral 12/2019   WISDOM TOOTH EXTRACTION      OB History   No obstetric history on file.      Home Medications    Prior to Admission medications   Medication Sig Start Date End Date Taking? Authorizing Provider  predniSONE  (DELTASONE ) 20 MG tablet Take 2  tablets (40 mg total) by mouth daily with breakfast for 5 days. 12/20/23 12/25/23 Yes Therman Hughlett, Jodi R, NP  cetirizine [facility] ALLERGY RELIEF, CETIRIZINE,) 10 MG tablet  06/16/22   [provider]  Cholecalciferol (VITAMIN D3) 2000 units TABS Take 1 tablet by mouth daily.    [provider]  DULoxetine (CYMBALTA) 60 MG capsule Take 60 mg by mouth daily. 02/01/22   [provider]  EPINEPHrine  (EPIPEN  2-PAK) 0.3 mg/0.3 mL IJ SOAJ injection Inject 0.3 mg into the muscle as needed for anaphylaxis. 02/01/23   Iva Marty Saltness, MD  fexofenadine [facility] ALLERGY) 180 MG tablet Take 180 mg by mouth daily.    [provider]  ibuprofen  (ADVIL ) 200 MG tablet Take 2 tablets (400 mg total) by mouth every 6 (six) hours as needed. 08/24/23   Tobie Eldora NOVAK, MD  losartan (COZAAR) 50 MG tablet Take 50 mg by mouth daily. 02/22/22   [provider]  omeprazole  (PRILOSEC) 40 MG capsule Take 40 mg by mouth daily. 01/21/22   [provider]  ondansetron  (ZOFRAN ) 4 MG tablet Take 4 mg by mouth every 8 (eight) hours as needed. 09/07/22   [provider]  ondansetron  (ZOFRAN -ODT) 4 MG disintegrating tablet Take 1 tablet (4 mg total) by mouth every 8 (eight) hours as needed for nausea or vomiting. 08/24/23   Tobie Eldora NOVAK, MD  promethazine -dextromethorphan (PROMETHAZINE -DM) 6.25-15 MG/5ML syrup Take 5 mLs  by mouth 4 (four) times daily as needed for cough. 11/24/23   Gladis Elsie BROCKS, PA-C  rosuvastatin (CRESTOR) 5 MG tablet Take 5 mg by mouth daily. 09/12/20   [provider]  RYALTRIS  [phone] MCG/ACT SUSP Place 1 spray into the nose 2 (two) times daily. 11/03/23   Iva Marty Saltness, MD  saline (AYR) GEL Place 1 Application into both nostrils every 4 (four) hours as needed. 04/07/23   Patel, Kunjan B, MD  tirzepatide [facility]) 10 MG/0.5ML injection vial 10mg  Subcutaneous weekly for 30 days    [provider]  traZODone  (DESYREL ) 50 MG tablet Take 50 mg  by mouth at bedtime as needed for sleep. 12/15/22   [provider]  traZODone  (DESYREL ) 50 MG tablet Take 1-2 tablets (50-100 mg total) by mouth at bedtime. 11/19/23       Family History Family History  Problem Relation Age of Onset   Allergic rhinitis Mother    Allergic rhinitis Father    Allergic rhinitis Sister    Migraines Sister    Mental illness Maternal Grandmother        schizophrenia   Cancer Paternal Grandmother        ovarian ,breast,lung   Breast cancer Paternal Grandmother    Sleep apnea Neg Hx     Social History Social History   Tobacco Use   Smoking status: Never   Smokeless tobacco: Never  Vaping Use   Vaping status: Never Used  Substance Use Topics   Alcohol use: Yes    Comment: rarely   Drug use: No     Allergies   Codeine, Dog epithelium (canis lupus familiaris), Fish allergy, Hydrocodone -acetaminophen , and Molds & smuts   Review of Systems Review of Systems  HENT:  Positive for congestion, sinus pressure, sinus pain and sneezing.   Respiratory:  Positive for cough.   Neurological:  Positive for headaches.     Physical Exam Triage Vital Signs ED Triage Vitals  Encounter Vitals Group     BP 12/20/23 1044 (!) 143/86     Girls Systolic BP Percentile --      Girls Diastolic BP Percentile --      Boys Systolic BP Percentile --      Boys Diastolic BP Percentile --      Pulse Rate 12/20/23 1044 65     Resp 12/20/23 1044 16     Temp 12/20/23 1044 98.1 F (36.7 C)     Temp Source 12/20/23 1044 Oral     SpO2 12/20/23 1044 98 %     Weight --      Height --      Head Circumference --      Peak Flow --      Pain Score 12/20/23 1040 5     Pain Loc --      Pain Education --      Exclude from Growth Chart --    No data found.  Updated Vital Signs BP (!) 143/86   Pulse 65   Temp 98.1 F (36.7 C) (Oral)   Resp 16   LMP 12/13/2023   SpO2 98%   Visual Acuity Right Eye Distance:   Left Eye Distance:   Bilateral Distance:     Right Eye Near:   Left Eye Near:    Bilateral Near:     Physical Exam Vitals and nursing note reviewed.  Constitutional:      General: She is not in acute distress.    Appearance: She is  well-developed. She is not ill-appearing.  HENT:     Head: Normocephalic and atraumatic.     Right Ear: Tympanic membrane and ear canal normal.     Left Ear: Tympanic membrane and ear canal normal.     Nose: Congestion present.     Right Turbinates: Not swollen or pale.     Left Turbinates: Not swollen or pale.     Right Sinus: Maxillary sinus tenderness and frontal sinus tenderness present.     Left Sinus: Maxillary sinus tenderness and frontal sinus tenderness present.     Mouth/Throat:     Mouth: Mucous membranes are moist.     Pharynx: Oropharynx is clear. Uvula midline. No oropharyngeal exudate or posterior oropharyngeal erythema.     Tonsils: No tonsillar exudate or tonsillar abscesses.  Eyes:     Conjunctiva/sclera: Conjunctivae normal.     Pupils: Pupils are equal, round, and reactive to light.  Cardiovascular:     Rate and Rhythm: Normal rate and regular rhythm.     Heart sounds: Normal heart sounds.  Pulmonary:     Effort: Pulmonary effort is normal.     Breath sounds: Normal breath sounds.  Musculoskeletal:     Cervical back: Normal range of motion and neck supple.  Lymphadenopathy:     Cervical: No cervical adenopathy.  Skin:    General: Skin is warm and dry.  Neurological:     General: No focal deficit present.     Mental Status: She is alert and oriented to person, place, and time.  Psychiatric:        Mood and Affect: Mood normal.        Behavior: Behavior normal.      UC Treatments / Results  Labs (all labs ordered are listed, but only abnormal results are displayed) Labs Reviewed  POC SOFIA SARS ANTIGEN FIA    EKG   Radiology No results found.  Procedures Procedures (including critical care time)  Medications Ordered in UC Medications - No data to  display  Initial Impression / Assessment and Plan / UC Course  I have reviewed the triage vital signs and the nursing notes.  Pertinent labs & imaging results that were available during my care of the patient were reviewed by me and considered in my medical decision making (see chart for details).     Reviewed exam and symptoms with patient.  No red flags.  Negative COVID testing.  Discussed viral illness/viral sinusitis rhinitis.  She will continue her nasal sprays, allergy medicine and nasal rinses as needed.  Will add on prednisone  daily for 5 days.  PCP follow-up if symptoms do not improve.  ER precautions reviewed Final Clinical Impressions(s) / UC Diagnoses   Final diagnoses:  Sinus congestion  Allergic rhinitis, unspecified seasonality, unspecified trigger  Viral illness     Discharge Instructions      You tested negative for COVID.  We start prednisone  daily for 5 days.  Continue your allergy medicine and nasal sprays/rinses as needed.  Follow-up with your PCP if your symptoms do not improve.  Please go to the ER for any worsening symptoms.  I hope you feel better soon!     ED Prescriptions     Medication Sig Dispense Auth. Provider   predniSONE  (DELTASONE ) 20 MG tablet Take 2 tablets (40 mg total) by mouth daily with breakfast for 5 days. 10 tablet Maisha Bogen, Jodi R, NP      PDMP not reviewed this encounter.   Rendy Lazard, Jodi R,  NP 12/20/23 1123

## 2023-12-20 NOTE — ED Triage Notes (Signed)
 Pt c/o dry cough, sneezing, HA, sinus painx4d.

## 2023-12-22 DIAGNOSIS — F411 Generalized anxiety disorder: Secondary | ICD-10-CM | POA: Diagnosis not present

## 2023-12-22 DIAGNOSIS — E785 Hyperlipidemia, unspecified: Secondary | ICD-10-CM | POA: Diagnosis not present

## 2023-12-22 DIAGNOSIS — R519 Headache, unspecified: Secondary | ICD-10-CM | POA: Diagnosis not present

## 2023-12-22 DIAGNOSIS — I10 Essential (primary) hypertension: Secondary | ICD-10-CM | POA: Diagnosis not present

## 2023-12-22 DIAGNOSIS — K219 Gastro-esophageal reflux disease without esophagitis: Secondary | ICD-10-CM | POA: Diagnosis not present

## 2023-12-26 ENCOUNTER — Other Ambulatory Visit (HOSPITAL_BASED_OUTPATIENT_CLINIC_OR_DEPARTMENT_OTHER): Payer: Self-pay

## 2023-12-26 ENCOUNTER — Other Ambulatory Visit: Payer: Self-pay

## 2023-12-27 ENCOUNTER — Other Ambulatory Visit (HOSPITAL_BASED_OUTPATIENT_CLINIC_OR_DEPARTMENT_OTHER): Payer: Self-pay

## 2023-12-27 ENCOUNTER — Other Ambulatory Visit (HOSPITAL_COMMUNITY): Payer: Self-pay

## 2023-12-27 ENCOUNTER — Ambulatory Visit (INDEPENDENT_AMBULATORY_CARE_PROVIDER_SITE_OTHER)

## 2023-12-27 DIAGNOSIS — J309 Allergic rhinitis, unspecified: Secondary | ICD-10-CM | POA: Diagnosis not present

## 2023-12-27 MED ORDER — DULOXETINE HCL 60 MG PO CPEP
60.0000 mg | ORAL_CAPSULE | Freq: Every day | ORAL | 3 refills | Status: AC
  Filled 2023-12-27 – 2024-01-02 (×2): qty 90, 90d supply, fill #0
  Filled 2024-04-04: qty 90, 90d supply, fill #1
  Filled 2024-04-09: qty 90, 90d supply, fill #0

## 2023-12-27 MED ORDER — HYDROXYZINE HCL 50 MG PO TABS
50.0000 mg | ORAL_TABLET | Freq: Four times a day (QID) | ORAL | 5 refills | Status: AC | PRN
  Filled 2023-12-27: qty 30, 8d supply, fill #0

## 2023-12-27 MED ORDER — LOSARTAN POTASSIUM 50 MG PO TABS
50.0000 mg | ORAL_TABLET | Freq: Every day | ORAL | 3 refills | Status: AC
  Filled 2023-12-27 – 2024-01-02 (×3): qty 90, 90d supply, fill #0
  Filled 2024-04-04: qty 90, 90d supply, fill #1
  Filled 2024-04-09: qty 90, 90d supply, fill #0

## 2023-12-27 MED ORDER — ROSUVASTATIN CALCIUM 5 MG PO TABS
5.0000 mg | ORAL_TABLET | Freq: Every day | ORAL | 3 refills | Status: AC
  Filled 2023-12-27 – 2024-01-02 (×2): qty 90, 90d supply, fill #0
  Filled 2024-04-04: qty 90, 90d supply, fill #1
  Filled 2024-04-09: qty 90, 90d supply, fill #0

## 2023-12-27 MED ORDER — OMEPRAZOLE 40 MG PO CPDR
40.0000 mg | DELAYED_RELEASE_CAPSULE | Freq: Every morning | ORAL | 3 refills | Status: AC
  Filled 2023-12-27 – 2024-01-02 (×2): qty 90, 90d supply, fill #0
  Filled 2024-04-04: qty 90, 90d supply, fill #1
  Filled 2024-04-09: qty 90, 90d supply, fill #0

## 2023-12-27 MED ORDER — FAMOTIDINE 20 MG PO TABS
20.0000 mg | ORAL_TABLET | Freq: Every day | ORAL | 2 refills | Status: AC
  Filled 2023-12-27: qty 30, 30d supply, fill #0

## 2023-12-27 MED ORDER — NURTEC 75 MG PO TBDP
75.0000 mg | ORAL_TABLET | Freq: Every day | ORAL | 3 refills | Status: AC
  Filled 2023-12-27: qty 8, 30d supply, fill #0
  Filled 2024-03-06: qty 8, 30d supply, fill #1

## 2024-01-02 ENCOUNTER — Other Ambulatory Visit (HOSPITAL_BASED_OUTPATIENT_CLINIC_OR_DEPARTMENT_OTHER): Payer: Self-pay

## 2024-01-16 ENCOUNTER — Other Ambulatory Visit (HOSPITAL_BASED_OUTPATIENT_CLINIC_OR_DEPARTMENT_OTHER): Payer: Self-pay

## 2024-01-16 DIAGNOSIS — Z6836 Body mass index (BMI) 36.0-36.9, adult: Secondary | ICD-10-CM | POA: Diagnosis not present

## 2024-01-16 DIAGNOSIS — E785 Hyperlipidemia, unspecified: Secondary | ICD-10-CM | POA: Diagnosis not present

## 2024-01-16 DIAGNOSIS — G4733 Obstructive sleep apnea (adult) (pediatric): Secondary | ICD-10-CM | POA: Diagnosis not present

## 2024-01-16 DIAGNOSIS — G4700 Insomnia, unspecified: Secondary | ICD-10-CM | POA: Diagnosis not present

## 2024-01-16 DIAGNOSIS — E66812 Obesity, class 2: Secondary | ICD-10-CM | POA: Diagnosis not present

## 2024-01-16 MED ORDER — PHENTERMINE HCL 37.5 MG PO TABS
37.5000 mg | ORAL_TABLET | Freq: Every day | ORAL | 1 refills | Status: AC
  Filled 2024-01-16: qty 30, 30d supply, fill #0

## 2024-01-24 ENCOUNTER — Ambulatory Visit

## 2024-01-24 DIAGNOSIS — J309 Allergic rhinitis, unspecified: Secondary | ICD-10-CM | POA: Diagnosis not present

## 2024-02-21 ENCOUNTER — Other Ambulatory Visit (HOSPITAL_BASED_OUTPATIENT_CLINIC_OR_DEPARTMENT_OTHER): Payer: Self-pay

## 2024-02-21 DIAGNOSIS — E66812 Obesity, class 2: Secondary | ICD-10-CM | POA: Diagnosis not present

## 2024-02-21 DIAGNOSIS — Z6835 Body mass index (BMI) 35.0-35.9, adult: Secondary | ICD-10-CM | POA: Diagnosis not present

## 2024-02-21 DIAGNOSIS — E785 Hyperlipidemia, unspecified: Secondary | ICD-10-CM | POA: Diagnosis not present

## 2024-02-21 DIAGNOSIS — G4700 Insomnia, unspecified: Secondary | ICD-10-CM | POA: Diagnosis not present

## 2024-02-21 DIAGNOSIS — G4733 Obstructive sleep apnea (adult) (pediatric): Secondary | ICD-10-CM | POA: Diagnosis not present

## 2024-02-21 MED ORDER — PHENTERMINE HCL 37.5 MG PO TABS
37.5000 mg | ORAL_TABLET | Freq: Every day | ORAL | 1 refills | Status: AC
  Filled 2024-02-21 – 2024-03-06 (×2): qty 30, 30d supply, fill #0

## 2024-03-01 ENCOUNTER — Ambulatory Visit (INDEPENDENT_AMBULATORY_CARE_PROVIDER_SITE_OTHER)

## 2024-03-01 DIAGNOSIS — J309 Allergic rhinitis, unspecified: Secondary | ICD-10-CM

## 2024-03-03 ENCOUNTER — Other Ambulatory Visit (HOSPITAL_BASED_OUTPATIENT_CLINIC_OR_DEPARTMENT_OTHER): Payer: Self-pay

## 2024-03-06 ENCOUNTER — Other Ambulatory Visit (HOSPITAL_BASED_OUTPATIENT_CLINIC_OR_DEPARTMENT_OTHER): Payer: Self-pay

## 2024-04-02 ENCOUNTER — Other Ambulatory Visit (HOSPITAL_BASED_OUTPATIENT_CLINIC_OR_DEPARTMENT_OTHER): Payer: Self-pay

## 2024-04-02 MED ORDER — PHENTERMINE HCL 37.5 MG PO TABS
37.5000 mg | ORAL_TABLET | Freq: Every day | ORAL | 1 refills | Status: AC
  Filled 2024-04-03: qty 30, 30d supply, fill #0

## 2024-04-03 ENCOUNTER — Other Ambulatory Visit (HOSPITAL_BASED_OUTPATIENT_CLINIC_OR_DEPARTMENT_OTHER): Payer: Self-pay

## 2024-04-03 ENCOUNTER — Ambulatory Visit

## 2024-04-03 DIAGNOSIS — J302 Other seasonal allergic rhinitis: Secondary | ICD-10-CM

## 2024-04-04 ENCOUNTER — Other Ambulatory Visit: Payer: Self-pay

## 2024-04-04 ENCOUNTER — Encounter: Payer: Self-pay | Admitting: Pharmacist

## 2024-04-09 ENCOUNTER — Other Ambulatory Visit: Payer: Self-pay

## 2024-04-09 ENCOUNTER — Other Ambulatory Visit (HOSPITAL_COMMUNITY): Payer: Self-pay

## 2024-04-09 ENCOUNTER — Encounter: Admitting: Obstetrics and Gynecology

## 2024-04-16 ENCOUNTER — Other Ambulatory Visit (HOSPITAL_BASED_OUTPATIENT_CLINIC_OR_DEPARTMENT_OTHER): Payer: Self-pay

## 2024-04-18 NOTE — Progress Notes (Signed)
 VIAL MADE ON 04/18/24

## 2024-05-08 ENCOUNTER — Ambulatory Visit: Admitting: Allergy & Immunology

## 2024-05-09 ENCOUNTER — Ambulatory Visit (INDEPENDENT_AMBULATORY_CARE_PROVIDER_SITE_OTHER): Admitting: Otolaryngology
